# Patient Record
Sex: Female | Born: 1990 | Race: White | Hispanic: No | Marital: Single | Smoking: Never smoker
Health system: Southern US, Community
[De-identification: ages and names within clinical notes are randomized; demographics above are authoritative.]

## PROBLEM LIST (undated history)

## (undated) ENCOUNTER — Inpatient Hospital Stay (HOSPITAL_COMMUNITY): Payer: Self-pay

## (undated) DIAGNOSIS — F32A Depression, unspecified: Secondary | ICD-10-CM

## (undated) DIAGNOSIS — Z3490 Encounter for supervision of normal pregnancy, unspecified, unspecified trimester: Secondary | ICD-10-CM

## (undated) DIAGNOSIS — H9390 Unspecified disorder of ear, unspecified ear: Secondary | ICD-10-CM

## (undated) DIAGNOSIS — R0602 Shortness of breath: Secondary | ICD-10-CM

## (undated) DIAGNOSIS — F329 Major depressive disorder, single episode, unspecified: Secondary | ICD-10-CM

## (undated) DIAGNOSIS — O0930 Supervision of pregnancy with insufficient antenatal care, unspecified trimester: Secondary | ICD-10-CM

## (undated) DIAGNOSIS — Z789 Other specified health status: Secondary | ICD-10-CM

## (undated) HISTORY — DX: Supervision of pregnancy with insufficient antenatal care, unspecified trimester: O09.30

## (undated) HISTORY — PX: MYRINGOTOMY: SHX2060

## (undated) HISTORY — DX: Unspecified disorder of ear, unspecified ear: H93.90

---
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
## 2011-06-27 NOTE — L&D Delivery Note (Signed)
Delivery Note At 2:28 PM a viable female was delivered via Vaginal, Spontaneous Delivery (Presentation:OA ; LOT  ).  APGAR: 9, 9; weight P.   Placenta status: delivered, intact , Spontaneous.  Cord: 3 vessels with the following complications: None.    Anesthesia: Epidural  Episiotomy: none Lacerations: B labial Suture Repair: 2.0 vicryl rapide Est. Blood Loss (mL): 500cc  Mom to postpartum.  Baby to with mommy.  [name],Nancy Horton 01/12/2012, 2:53 PM  Br/ Nexplanon vs Depo/ A+

## 2011-07-11 ENCOUNTER — Encounter (HOSPITAL_COMMUNITY): Payer: Self-pay | Admitting: *Deleted

## 2011-07-11 ENCOUNTER — Emergency Department (HOSPITAL_COMMUNITY)
Admission: EM | Admit: 2011-07-11 | Discharge: 2011-07-11 | Disposition: A | Discharge status: Home / Self Care | Payer: Self-pay | Attending: Emergency Medicine | Admitting: Emergency Medicine

## 2011-07-11 DIAGNOSIS — J3489 Other specified disorders of nose and nasal sinuses: Secondary | ICD-10-CM | POA: Insufficient documentation

## 2011-07-11 DIAGNOSIS — O9989 Other specified diseases and conditions complicating pregnancy, childbirth and the puerperium: Secondary | ICD-10-CM | POA: Insufficient documentation

## 2011-07-11 DIAGNOSIS — R6883 Chills (without fever): Secondary | ICD-10-CM | POA: Insufficient documentation

## 2011-07-11 DIAGNOSIS — R05 Cough: Secondary | ICD-10-CM | POA: Insufficient documentation

## 2011-07-11 DIAGNOSIS — H9209 Otalgia, unspecified ear: Secondary | ICD-10-CM | POA: Insufficient documentation

## 2011-07-11 DIAGNOSIS — R51 Headache: Secondary | ICD-10-CM | POA: Insufficient documentation

## 2011-07-11 DIAGNOSIS — R070 Pain in throat: Secondary | ICD-10-CM | POA: Insufficient documentation

## 2011-07-11 DIAGNOSIS — R059 Cough, unspecified: Secondary | ICD-10-CM | POA: Insufficient documentation

## 2011-07-11 DIAGNOSIS — H6690 Otitis media, unspecified, unspecified ear: Secondary | ICD-10-CM | POA: Insufficient documentation

## 2011-07-11 LAB — URINE CULTURE: Culture  Setup Time: 201301152004

## 2011-07-11 LAB — URINALYSIS, ROUTINE W REFLEX MICROSCOPIC
Glucose, UA: NEGATIVE mg/dL
Nitrite: NEGATIVE
Protein, ur: NEGATIVE mg/dL
pH: 7 (ref 5.0–8.0)

## 2011-07-11 LAB — URINE MICROSCOPIC-ADD ON

## 2011-07-11 MED ORDER — PRENATAL RX 60-1 MG PO TABS: 1.0000 | ORAL_TABLET | Freq: Every day | ORAL | Status: DC

## 2011-07-11 MED ORDER — AMOXICILLIN 500 MG PO CAPS: 500.0000 mg | ORAL_CAPSULE | Freq: Three times a day (TID) | ORAL | Status: AC

## 2011-07-11 NOTE — ED Provider Notes (Signed)
History     CSN: [id number]  Arrival date & time 07/11/11  1717   First MD Initiated Contact with Patient 07/11/11 1833      Chief Complaint  Patient presents with  . Chills     Patient is a 21 y.o. female presenting with cough. The history is provided by the patient.  Cough This is a new problem. The current episode started 2 days ago. The problem occurs hourly. The problem has not changed since onset.The cough is non-productive. Associated symptoms include chills, ear congestion, ear pain, rhinorrhea and sore throat.  pt reports mild headache Reports cough/congestion Left ear pain reported No significant abd pain No back pain Approximately [redacted] weeks pregnant, no prenatal care  PMH - none  History reviewed. No pertinent past surgical history.  History reviewed. No pertinent family history.  History  Substance Use Topics  . Smoking status: Never Smoker   . Smokeless tobacco: Not on file  . Alcohol Use: No    OB History    Grav Para Term Preterm Abortions TAB SAB Ect Mult Living   1               Review of Systems  Constitutional: Positive for chills.  HENT: Positive for ear pain, sore throat and rhinorrhea.   Respiratory: Positive for cough.     Allergies  Review of patient's allergies indicates no known allergies.  Home Medications   Current Outpatient Rx  Name Route Sig Dispense Refill  . ACETAMINOPHEN 500 MG PO TABS Oral Take 500 mg by mouth every 6 (six) hours as needed. For headache    . OVER THE COUNTER MEDICATION Oral Take 1 capsule by mouth every 6 (six) hours as needed. For cold     Over the counter cold medicine    . AMOXICILLIN 500 MG PO CAPS Oral Take 1 capsule (500 mg total) by mouth 3 (three) times daily. 21 capsule 0  . PRENATAL RX 60-1 MG PO TABS Oral Take 1 tablet by mouth daily. 30 tablet 0    BP 108/74  Pulse 93  Temp(Src) 97.9 F (36.6 C) (Oral)  Resp 18  SpO2 99%  Physical Exam CONSTITUTIONAL: Well developed/well  nourished HEAD AND FACE: Normocephalic/atraumatic EYES: EOMI/PERRL ENMT: Mucous membranes moist, left TM - erythema noted to left TM with fluid in canal.  ?ruptured TM NECK: supple no meningeal signs SPINE:entire spine nontender CV: S1/S2 noted, no murmurs/rubs/gallops noted LUNGS: Lungs are clear to auscultation bilaterally, no apparent distress ABDOMEN: soft, nontender, no rebound or guarding Pt does not appear gravid GU:no cva tenderness NEURO: Pt is awake/alert, moves all extremitiesx4 EXTREMITIES: pulses normal, full ROM SKIN: warm, color normal PSYCH: no abnormalities of mood noted   ED Course  Procedures   Labs Reviewed  URINALYSIS, ROUTINE W REFLEX MICROSCOPIC - Abnormal; Notable for the following:    APPearance CLOUDY (*)    Ketones, ur >80 (*)    Leukocytes, UA MODERATE (*)    All other components within normal limits  URINE MICROSCOPIC-ADD ON - Abnormal; Notable for the following:    Squamous Epithelial / LPF MANY (*)    Bacteria, UA FEW (*)    All other components within normal limits  PREGNANCY, URINE  POCT PREGNANCY, URINE  URINE CULTURE    1. Otitis media       MDM  Nursing notes reviewed and considered in documentation All labs/vitals reviewed and considered      Will start abx for her presumed OM with  effusion, ?ruptured TM Otherwise, influenza possible, but well appearing, afebrile, onset at least 48 hrs ago Urine sent for culture, but she denies abd pain/dysuria/flank pain   Joya Gaskins, MD 07/11/11 1947

## 2011-07-11 NOTE — ED Notes (Signed)
The pt says she is probabily approx 3 months pregnant.  lmp 3 months ago

## 2011-07-11 NOTE — ED Notes (Signed)
pts pregnancy test was POSITIVE

## 2011-07-11 NOTE — Discharge Instructions (Signed)
Otitis Media, Adult A middle ear infection is an infection in the space behind the eardrum. The medical name for this is "otitis media." It may happen after a common cold. It is caused by a germ that starts growing in that space. You may feel swollen glands in your neck on the side of the ear infection. HOME CARE INSTRUCTIONS   Take your medicine as directed until it is gone, even if you feel better after the first few days.   Only take over-the-counter or prescription medicines for pain, discomfort, or fever as directed by your caregiver.   Occasional use of a nasal decongestant a couple times per day may help with discomfort and help the eustachian tube to drain better.  Follow up with your caregiver in 10 to 14 days or as directed, to be certain that the infection has cleared. Not keeping the appointment could result in a chronic or permanent injury, pain, hearing loss and disability. If there is any problem keeping the appointment, you must call back to this facility for assistance. SEEK IMMEDIATE MEDICAL CARE IF:   You are not getting better in 2 to 3 days.   You have pain that is not controlled with medication.   You feel worse instead of better.   You cannot use the medication as directed.   You develop swelling, redness or pain around the ear or stiffness in your neck.  MAKE SURE YOU:   Understand these instructions.   Will watch your condition.   Will get help right away if you are not doing well or get worse.  Document Released: 03/17/2004 Document Revised: 02/22/2011 Document Reviewed: 01/17/2008 [facility] Patient Information 2012 [place], [place].  RESOURCE GUIDE  Dental Problems  Patients with Medicaid: [facility] [phone]                                           [phone] [address] Phone:  [phone]                                                  Phone:  [phone]  If unable to pay or uninsured,  contact:  [facility] or [facility]. to become qualified for the adult dental clinic.  Chronic Pain Problems Contact [facility]  [phone] Patients need to be referred by their primary care doctor.  Insufficient Money for Medicine Contact [employer]:  call "211" or [facility] [phone].  No Primary Care Doctor Call [facility]  [phone] Other agencies that provide inexpensive medical care    [facility]  [phone]    [facility]  [phone]    [facility]  [phone]    [facility]  [phone]    [facility]  [phone]    [facility]  [phone]  Psychological Services [facility]  [phone] [facility]  [phone] [facility] Mental [facility]   [phone] (emergency services [phone])  Substance Abuse Resources [facility]  [phone] [facility] [phone] [facility] [phone] [facility]  [phone] Residential & Outpatient Substance Abuse Program  [phone]  Abuse/Neglect [facility] Child Abuse Hotline 603-243-5385 [facility] Child Abuse Hotline 805-468-5268 (After Hours)  Emergency Shelter [facility] [phone]  Maternity Homes [facility] [place] [facility] [phone] [facility] [phone]  MRSA Hotline #:   480-428-2914    [facility] Resources  [facility]     [employer]                          [facility] Dept. [address] [place]                       [address]      [address]  Blondell Reveal Phone:  [phone]                                   Phone:  [phone]                 Phone:  [phone]  [facility] Phone:  [phone]  [facility] Child Abuse  Hotline [phone] (After Hours)

## 2011-07-11 NOTE — ED Notes (Signed)
The pt has a cold and she has aching all over her body.  Headache sinus drainage all day

## 2011-07-14 NOTE — ED Notes (Signed)
+   Urine Patient treated with Amox-sensitive to same-chart appended per protocol MD 

## 2011-07-31 ENCOUNTER — Inpatient Hospital Stay (HOSPITAL_COMMUNITY)
Admission: AD | Admit: 2011-07-31 | Discharge: 2011-07-31 | Disposition: A | Discharge status: Home / Self Care | Payer: Self-pay | Source: Ambulatory Visit | Attending: Family Medicine | Admitting: Family Medicine

## 2011-07-31 ENCOUNTER — Encounter (HOSPITAL_COMMUNITY): Payer: Self-pay

## 2011-07-31 DIAGNOSIS — Z3201 Encounter for pregnancy test, result positive: Secondary | ICD-10-CM | POA: Insufficient documentation

## 2011-07-31 HISTORY — DX: Other specified health status: Z78.9

## 2011-07-31 NOTE — Progress Notes (Signed)
Patient states she has not had any prenatal care yet and need proof of pregnancy letter. Is not having any problems. Denies any pain, bleeding or leaking, no vomiting, diarrhea or discharge.

## 2011-07-31 NOTE — ED Provider Notes (Signed)
History   Pt presents today wishing to have a pregnancy verification letter. She states she is having no problems and she only needs confirmation for her insurance. She denies lower abd pain, vag dc, bleeding, or any other problems at this time.  No chief complaint on file.  HPI  OB History    Grav Para Term Preterm Abortions TAB SAB Ect Mult Living   1 0 0       0      Past Medical History  Diagnosis Date  . No pertinent past medical history     Past Surgical History  Procedure Date  . Myringotomy     Family History  Problem Relation Age of Onset  . Diabetes Maternal Grandfather     History  Substance Use Topics  . Smoking status: Never Smoker   . Smokeless tobacco: Never Used  . Alcohol Use: No    Allergies: No Known Allergies  Prescriptions prior to admission  Medication Sig Dispense Refill  . acetaminophen (TYLENOL) 500 MG tablet Take 500 mg by mouth every 6 (six) hours as needed. For headache      . OVER THE COUNTER MEDICATION Take 1 capsule by mouth every 6 (six) hours as needed. For cold     Over the counter cold medicine      . Prenatal Vit-Fe Fumarate-FA (PRENATAL MULTIVITAMIN) 60-1 MG tablet Take 1 tablet by mouth daily.  30 tablet  0    Review of Systems  Constitutional: Negative for fever.  Eyes: Negative for blurred vision and double vision.  Respiratory: Negative for cough, hemoptysis, sputum production, shortness of breath and wheezing.   Cardiovascular: Negative for chest pain and palpitations.  Gastrointestinal: Negative for nausea, vomiting, abdominal pain, diarrhea, constipation and blood in stool.  Genitourinary: Negative for dysuria, urgency, frequency and hematuria.  Neurological: Negative for dizziness and headaches.  Psychiatric/Behavioral: Negative for depression and suicidal ideas.   Physical Exam   Blood pressure 109/62, pulse 99, temperature 98.4 F (36.9 C), temperature source Oral, resp. rate 16, height 5' 1.5" (1.562 m), weight  164 lb (74.39 kg), last menstrual period 04/15/2011.  Physical Exam  Nursing note and vitals reviewed. Constitutional: She appears well-developed and well-nourished. No distress.  HENT:  Head: Normocephalic and atraumatic.  Eyes: EOM are normal. Pupils are equal, round, and reactive to light.  GI: Soft. She exhibits no distension and no mass. There is no tenderness. There is no rebound and no guarding.  Neurological: She is alert. She has normal reflexes.  Skin: Skin is warm and dry. She is not diaphoretic.  Psychiatric: She has a normal mood and affect. Her behavior is normal. Judgment and thought content normal.    MAU Course  Procedures  Preg test positive. FHTs positive.  Assessment and Plan  Preg test positive: discussed with pt at length. She is to begin prenatal care. Discussed diet, activity, risks, and precautions.  Clinton Gallant. Rice III, DrHSc, MPAS, PA-C  07/31/2011, 4:01 PM   Henrietta Hoover, PA 07/31/11 1605

## 2011-08-01 NOTE — ED Provider Notes (Signed)
Chart reviewed and agree with management and plan.  

## 2011-10-31 LAB — OB RESULTS CONSOLE ANTIBODY SCREEN: Antibody Screen: NEGATIVE

## 2011-10-31 LAB — OB RESULTS CONSOLE HEPATITIS B SURFACE ANTIGEN: Hepatitis B Surface Ag: NEGATIVE

## 2011-10-31 LAB — OB RESULTS CONSOLE RUBELLA ANTIBODY, IGM: Rubella: IMMUNE

## 2011-10-31 LAB — OB RESULTS CONSOLE GC/CHLAMYDIA: Chlamydia: NEGATIVE

## 2011-10-31 LAB — OB RESULTS CONSOLE ABO/RH

## 2011-11-02 ENCOUNTER — Other Ambulatory Visit (HOSPITAL_COMMUNITY): Payer: Self-pay | Admitting: Obstetrics and Gynecology

## 2011-11-02 DIAGNOSIS — O2690 Pregnancy related conditions, unspecified, unspecified trimester: Secondary | ICD-10-CM

## 2011-11-09 ENCOUNTER — Ambulatory Visit (HOSPITAL_COMMUNITY)
Admission: RE | Admit: 2011-11-09 | Discharge: 2011-11-09 | Disposition: A | Discharge status: Home / Self Care | Payer: Medicaid Other | Source: Ambulatory Visit | Attending: Obstetrics and Gynecology | Admitting: Obstetrics and Gynecology

## 2011-11-09 ENCOUNTER — Other Ambulatory Visit: Payer: Self-pay

## 2011-11-09 VITALS — BP 89/63 | HR 70 | Wt 175.0 lb

## 2011-11-09 DIAGNOSIS — Z1389 Encounter for screening for other disorder: Secondary | ICD-10-CM | POA: Insufficient documentation

## 2011-11-09 DIAGNOSIS — O2690 Pregnancy related conditions, unspecified, unspecified trimester: Secondary | ICD-10-CM

## 2011-11-09 DIAGNOSIS — O358XX0 Maternal care for other (suspected) fetal abnormality and damage, not applicable or unspecified: Secondary | ICD-10-CM | POA: Insufficient documentation

## 2011-11-09 DIAGNOSIS — Z363 Encounter for antenatal screening for malformations: Secondary | ICD-10-CM | POA: Insufficient documentation

## 2011-11-09 DIAGNOSIS — O0930 Supervision of pregnancy with insufficient antenatal care, unspecified trimester: Secondary | ICD-10-CM | POA: Insufficient documentation

## 2011-11-09 NOTE — Progress Notes (Signed)
Ms. Domanski was seen for ultrasound appointment today.  Please see [facility] report for details.

## 2012-01-08 ENCOUNTER — Encounter (HOSPITAL_COMMUNITY): Payer: Self-pay | Admitting: *Deleted

## 2012-01-08 ENCOUNTER — Telehealth (HOSPITAL_COMMUNITY): Payer: Self-pay | Admitting: *Deleted

## 2012-01-08 NOTE — Telephone Encounter (Signed)
Preadmission screen  

## 2012-01-11 ENCOUNTER — Encounter (HOSPITAL_COMMUNITY): Payer: Self-pay

## 2012-01-11 DIAGNOSIS — Z3490 Encounter for supervision of normal pregnancy, unspecified, unspecified trimester: Secondary | ICD-10-CM

## 2012-01-11 DIAGNOSIS — O0930 Supervision of pregnancy with insufficient antenatal care, unspecified trimester: Secondary | ICD-10-CM | POA: Insufficient documentation

## 2012-01-11 HISTORY — DX: Encounter for supervision of normal pregnancy, unspecified, unspecified trimester: Z34.90

## 2012-01-11 NOTE — H&P (Signed)
Nancy Horton is a 21 y.o. female G1P0 at 28+ for IOL given post dates.  D/W pt r/b/a of IOL.  Uncomplicated prenatal care, except late entry to care, dated by 86 + week [place].   Maternal Medical History:  Fetal activity: Perceived fetal activity is normal.      OB History    Grav Para Term Preterm Abortions TAB SAB Ect Mult Living   1 0 0       0    G1 present, late entry to care Past Medical History  Diagnosis Date  . No pertinent past medical history   . Late prenatal care   . Ear problems   . Normal pregnancy 01/11/2012   Past Surgical History  Procedure Date  . Myringotomy    Family History: family history includes Diabetes in her maternal grandfather and Heart attack in her father. Social History:  reports that she has never smoked. She has never used smokeless tobacco. She reports that she does not drink alcohol or use illicit drugs.  Meds PNV All NKDA   Prenatal Transfer Tool  Maternal Diabetes: No Genetic Screening: Declined too late to care Maternal Ultrasounds/Referrals: Normal, limited anat Fetal Ultrasounds or other Referrals:  Referred to Materal Fetal Medicine  completes anat Maternal Substance Abuse:  No Significant Maternal Medications:  None Significant Maternal Lab Results:  Lab values include: Group B Strep negative Other Comments:  None  Review of Systems  Constitutional: Negative.   HENT: Negative.   Respiratory: Negative.   Cardiovascular: Negative.   Gastrointestinal: Negative.   Genitourinary: Negative.   Musculoskeletal: Negative.   Skin: Negative.   Neurological: Negative.   Psychiatric/Behavioral: Negative.       Last menstrual period 04/15/2011. Maternal Exam:  Abdomen: Fundal height is appropriate for gestation.   Estimated fetal weight is 8#.   Fetal presentation: vertex     Physical Exam  Constitutional: She is oriented to person, place, and time. She appears well-developed and well-nourished.  HENT:  Head: Normocephalic  and atraumatic.  Neck: Normal range of motion. Neck supple. No thyromegaly present.  Cardiovascular: Normal rate and regular rhythm.   Respiratory: Effort normal and breath sounds normal. No respiratory distress.  GI: Soft. Bowel sounds are normal. There is no tenderness.  Musculoskeletal: Normal range of motion.  Neurological: She is alert and oriented to person, place, and time.  Skin: Skin is warm and dry.  Psychiatric: She has a normal mood and affect. Her behavior is normal.    Prenatal labs: ABO, Rh: A/Positive/-- (05/07 0000) Antibody: Negative (05/07 0000) Rubella: Immune (05/07 0000) RPR: Nonreactive (05/07 0000)  HBsAg: Negative (05/07 0000)  HIV: Non-reactive (05/07 0000)  GBS: Negative (06/14 0000)  Hgb 10.8/ Plt 215K/ GC neg/ Chl neg/ CF neg/ genetic screening too late to care  [place] 5/6 [facility] 01/02/12, limited anatomy, female, posterior placenta  MFM [place] - completes anatomy  Tdap 10/31/11  Assessment/Plan: 21yo G1P0 at 41+ for iol given postdates gbbs negative Pitocin and AROM for induction Expect SVD    [name],Tessa Seaberry 01/11/2012, 11:07 AM

## 2012-01-12 ENCOUNTER — Encounter (HOSPITAL_COMMUNITY): Payer: Self-pay

## 2012-01-12 ENCOUNTER — Inpatient Hospital Stay (HOSPITAL_COMMUNITY): Payer: Medicaid Other | Admitting: Anesthesiology

## 2012-01-12 ENCOUNTER — Encounter (HOSPITAL_COMMUNITY): Payer: Self-pay | Admitting: Anesthesiology

## 2012-01-12 ENCOUNTER — Inpatient Hospital Stay (HOSPITAL_COMMUNITY)
Admission: RE | Admit: 2012-01-12 | Discharge: 2012-01-14 | DRG: 775 | Disposition: A | Discharge status: Home / Self Care | Payer: Medicaid Other | Source: Ambulatory Visit | Attending: Obstetrics and Gynecology | Admitting: Obstetrics and Gynecology

## 2012-01-12 VITALS — BP 100/63 | HR 70 | Temp 97.8°F | Resp 18 | Ht 62.0 in | Wt 175.0 lb

## 2012-01-12 DIAGNOSIS — O480 Post-term pregnancy: Principal | ICD-10-CM | POA: Diagnosis present

## 2012-01-12 DIAGNOSIS — O0930 Supervision of pregnancy with insufficient antenatal care, unspecified trimester: Secondary | ICD-10-CM

## 2012-01-12 DIAGNOSIS — Z3490 Encounter for supervision of normal pregnancy, unspecified, unspecified trimester: Secondary | ICD-10-CM

## 2012-01-12 HISTORY — DX: Shortness of breath: R06.02

## 2012-01-12 HISTORY — DX: Encounter for supervision of normal pregnancy, unspecified, unspecified trimester: Z34.90

## 2012-01-12 HISTORY — DX: Major depressive disorder, single episode, unspecified: F32.9

## 2012-01-12 HISTORY — DX: Depression, unspecified: F32.A

## 2012-01-12 LAB — CBC
MCH: 28.2 pg (ref 26.0–34.0)
MCHC: 32.7 g/dL (ref 30.0–36.0)
MCV: 86.2 fL (ref 78.0–100.0)
Platelets: 191 10*3/uL (ref 150–400)
RDW: 14.6 % (ref 11.5–15.5)

## 2012-01-12 LAB — RPR: RPR Ser Ql: NONREACTIVE

## 2012-01-12 MED ORDER — BENZOCAINE-MENTHOL 20-0.5 % EX AERO
1.0000 "application " | INHALATION_SPRAY | CUTANEOUS | Status: DC | PRN
  Administered 2012-01-12: 1 via TOPICAL
  Filled 2012-01-12: qty 56

## 2012-01-12 MED ORDER — FENTANYL 2.5 MCG/ML BUPIVACAINE 1/10 % EPIDURAL INFUSION (WH - ANES)
INTRAMUSCULAR | Status: DC | PRN
  Administered 2012-01-12: 14 mL/h via EPIDURAL

## 2012-01-12 MED ORDER — PHENYLEPHRINE 40 MCG/ML (10ML) SYRINGE FOR IV PUSH (FOR BLOOD PRESSURE SUPPORT)
80.0000 ug | PREFILLED_SYRINGE | INTRAVENOUS | Status: DC | PRN
  Filled 2012-01-12: qty 5

## 2012-01-12 MED ORDER — ZOLPIDEM TARTRATE 5 MG PO TABS: 5.0000 mg | ORAL_TABLET | Freq: Every evening | ORAL | Status: DC | PRN

## 2012-01-12 MED ORDER — IBUPROFEN 600 MG PO TABS: 600.0000 mg | ORAL_TABLET | Freq: Four times a day (QID) | ORAL | Status: DC | PRN

## 2012-01-12 MED ORDER — DIBUCAINE 1 % RE OINT: 1.0000 "application " | TOPICAL_OINTMENT | RECTAL | Status: DC | PRN

## 2012-01-12 MED ORDER — DIPHENHYDRAMINE HCL 25 MG PO CAPS: 25.0000 mg | ORAL_CAPSULE | Freq: Four times a day (QID) | ORAL | Status: DC | PRN

## 2012-01-12 MED ORDER — ACETAMINOPHEN 325 MG PO TABS: 650.0000 mg | ORAL_TABLET | ORAL | Status: DC | PRN

## 2012-01-12 MED ORDER — SENNOSIDES-DOCUSATE SODIUM 8.6-50 MG PO TABS
2.0000 | ORAL_TABLET | Freq: Every day | ORAL | Status: DC
  Administered 2012-01-12: 2 via ORAL

## 2012-01-12 MED ORDER — FLEET ENEMA 7-19 GM/118ML RE ENEM: 1.0000 | ENEMA | RECTAL | Status: DC | PRN

## 2012-01-12 MED ORDER — PRENATAL MULTIVITAMIN CH
1.0000 | ORAL_TABLET | Freq: Every day | ORAL | Status: DC
  Administered 2012-01-12 – 2012-01-14 (×3): 1 via ORAL
  Filled 2012-01-12 (×3): qty 1

## 2012-01-12 MED ORDER — OXYCODONE-ACETAMINOPHEN 5-325 MG PO TABS: 1.0000 | ORAL_TABLET | ORAL | Status: DC | PRN

## 2012-01-12 MED ORDER — ONDANSETRON HCL 4 MG/2ML IJ SOLN: 4.0000 mg | Freq: Four times a day (QID) | INTRAMUSCULAR | Status: DC | PRN

## 2012-01-12 MED ORDER — LANOLIN HYDROUS EX OINT: TOPICAL_OINTMENT | CUTANEOUS | Status: DC | PRN

## 2012-01-12 MED ORDER — LACTATED RINGERS IV SOLN: 500.0000 mL | Freq: Once | INTRAVENOUS | Status: DC

## 2012-01-12 MED ORDER — OXYTOCIN BOLUS FROM INFUSION
250.0000 mL | Freq: Once | INTRAVENOUS | Status: DC
  Filled 2012-01-12: qty 500

## 2012-01-12 MED ORDER — OXYTOCIN 40 UNITS IN LACTATED RINGERS INFUSION - SIMPLE MED
62.5000 mL/h | Freq: Once | INTRAVENOUS | Status: AC
  Administered 2012-01-12: 999 mL/h via INTRAVENOUS

## 2012-01-12 MED ORDER — SIMETHICONE 80 MG PO CHEW: 80.0000 mg | CHEWABLE_TABLET | ORAL | Status: DC | PRN

## 2012-01-12 MED ORDER — FENTANYL 2.5 MCG/ML BUPIVACAINE 1/10 % EPIDURAL INFUSION (WH - ANES)
14.0000 mL/h | INTRAMUSCULAR | Status: DC
  Administered 2012-01-12: 14 mL/h via EPIDURAL
  Filled 2012-01-12 (×2): qty 60

## 2012-01-12 MED ORDER — DIPHENHYDRAMINE HCL 50 MG/ML IJ SOLN
12.5000 mg | INTRAMUSCULAR | Status: DC | PRN
Start: 2012-01-12 — End: 2012-01-12

## 2012-01-12 MED ORDER — EPHEDRINE 5 MG/ML INJ
10.0000 mg | INTRAVENOUS | Status: DC | PRN
  Filled 2012-01-12: qty 4

## 2012-01-12 MED ORDER — EPHEDRINE 5 MG/ML INJ: 10.0000 mg | INTRAVENOUS | Status: DC | PRN

## 2012-01-12 MED ORDER — TERBUTALINE SULFATE 1 MG/ML IJ SOLN: 0.2500 mg | Freq: Once | INTRAMUSCULAR | Status: DC | PRN

## 2012-01-12 MED ORDER — LACTATED RINGERS IV SOLN
INTRAVENOUS | Status: DC
  Administered 2012-01-12: 10:00:00 via INTRAVENOUS
  Administered 2012-01-12: 1000 mL via INTRAVENOUS

## 2012-01-12 MED ORDER — LACTATED RINGERS IV SOLN: INTRAVENOUS | Status: DC

## 2012-01-12 MED ORDER — PRENATAL MULTIVITAMIN CH: 1.0000 | ORAL_TABLET | Freq: Every day | ORAL | Status: DC

## 2012-01-12 MED ORDER — CITRIC ACID-SODIUM CITRATE 334-500 MG/5ML PO SOLN: 30.0000 mL | ORAL | Status: DC | PRN

## 2012-01-12 MED ORDER — TETANUS-DIPHTH-ACELL PERTUSSIS 5-2.5-18.5 LF-MCG/0.5 IM SUSP: 0.5000 mL | Freq: Once | INTRAMUSCULAR | Status: DC

## 2012-01-12 MED ORDER — LIDOCAINE HCL (PF) 1 % IJ SOLN: 30.0000 mL | INTRAMUSCULAR | Status: DC | PRN

## 2012-01-12 MED ORDER — OXYTOCIN 40 UNITS IN LACTATED RINGERS INFUSION - SIMPLE MED
1.0000 m[IU]/min | INTRAVENOUS | Status: DC
  Administered 2012-01-12: 2 m[IU]/min via INTRAVENOUS
  Filled 2012-01-12: qty 1000

## 2012-01-12 MED ORDER — SODIUM BICARBONATE 8.4 % IV SOLN
INTRAVENOUS | Status: DC | PRN
  Administered 2012-01-12: 4 mL via EPIDURAL

## 2012-01-12 MED ORDER — LACTATED RINGERS IV SOLN: 500.0000 mL | INTRAVENOUS | Status: DC | PRN

## 2012-01-12 MED ORDER — ONDANSETRON HCL 4 MG/2ML IJ SOLN: 4.0000 mg | INTRAMUSCULAR | Status: DC | PRN

## 2012-01-12 MED ORDER — IBUPROFEN 600 MG PO TABS
600.0000 mg | ORAL_TABLET | Freq: Four times a day (QID) | ORAL | Status: DC
  Administered 2012-01-12 – 2012-01-14 (×7): 600 mg via ORAL
  Filled 2012-01-12 (×7): qty 1

## 2012-01-12 MED ORDER — ONDANSETRON HCL 4 MG PO TABS: 4.0000 mg | ORAL_TABLET | ORAL | Status: DC | PRN

## 2012-01-12 MED ORDER — PHENYLEPHRINE 40 MCG/ML (10ML) SYRINGE FOR IV PUSH (FOR BLOOD PRESSURE SUPPORT): 80.0000 ug | PREFILLED_SYRINGE | INTRAVENOUS | Status: DC | PRN

## 2012-01-12 MED ORDER — WITCH HAZEL-GLYCERIN EX PADS: 1.0000 "application " | MEDICATED_PAD | CUTANEOUS | Status: DC | PRN

## 2012-01-12 NOTE — Progress Notes (Signed)
Patient ID: Nancy Horton, female   DOB: 1991/06/14, 21 y.o.   MRN: [id number] 20yo G1P0 at 41+ dated by 31 week [place] for iol +FM, no LOF, no VB< occ ctx AFVSS FHTs 130's, mod var toco q 1-2 SVE 4.5/90/0 AROM for clear fluid, fingers presenting with head  Pitocin at 4 mU Expect SVD

## 2012-01-12 NOTE — Anesthesia Preprocedure Evaluation (Addendum)

## 2012-01-12 NOTE — Anesthesia Procedure Notes (Signed)

## 2012-01-13 LAB — CBC
Hemoglobin: 8.8 g/dL — ABNORMAL LOW (ref 12.0–15.0)
MCH: 28.5 pg (ref 26.0–34.0)
MCHC: 32.8 g/dL (ref 30.0–36.0)
Platelets: 191 10*3/uL (ref 150–400)
RDW: 14.8 % (ref 11.5–15.5)

## 2012-01-13 NOTE — Progress Notes (Signed)
Post Partum Day 1 Subjective: no complaints, up ad lib, tolerating PO and nl lochia, pain controlled  Objective: Blood pressure 96/60, pulse 73, temperature 98.2 F (36.8 C), temperature source Oral, resp. rate 18, height 5\' 2"  (1.575 m), weight 79.379 kg (175 lb), last menstrual period 04/15/2011, SpO2 100.00%, unknown if currently breastfeeding.  Physical Exam:  General: alert and no distress Lochia: appropriate Uterine Fundus: firm   Basename 01/13/12 0500 01/12/12 0705  HGB 8.8* 10.8*  HCT 26.8* 33.0*    Assessment/Plan: Plan for discharge tomorrow, Breastfeeding and Lactation consult.  Doing well.     LOS: 1 day   [name],Bryant Saye 01/13/2012, 10:24 AM

## 2012-01-14 MED ORDER — IBUPROFEN 800 MG PO TABS: 800.0000 mg | ORAL_TABLET | Freq: Three times a day (TID) | ORAL | Status: AC | PRN

## 2012-01-14 MED ORDER — OXYCODONE-ACETAMINOPHEN 5-325 MG PO TABS: 1.0000 | ORAL_TABLET | Freq: Four times a day (QID) | ORAL | Status: AC | PRN

## 2012-01-14 MED ORDER — PRENATAL MULTIVITAMIN CH: 1.0000 | ORAL_TABLET | Freq: Every day | ORAL | Status: DC

## 2012-01-14 NOTE — Progress Notes (Signed)
Post Partum Day 2 Subjective: no complaints, up ad lib, tolerating PO and nl lochia, pain controlled  Objective: Blood pressure 100/63, pulse 70, temperature 97.8 F (36.6 C), temperature source Oral, resp. rate 18, height 5\' 2"  (1.575 m), weight 79.379 kg (175 lb), last menstrual period 04/15/2011, SpO2 98.00%, unknown if currently breastfeeding.  Physical Exam:  General: alert and no distress Lochia: appropriate Uterine Fundus: firm    Basename 01/13/12 0500 01/12/12 0705  HGB 8.8* 10.8*  HCT 26.8* 33.0*    Assessment/Plan: Discharge home, Breastfeeding and Lactation consult.  D/c home with motrin/percocet/pnv, f/u 6 weeks   LOS: 2 days   [name],Wyatt Galvan 01/14/2012, 9:36 AM

## 2012-01-14 NOTE — Discharge Summary (Signed)
Obstetric Discharge Summary Reason for Admission: induction of labor Prenatal Procedures: none Intrapartum Procedures: spontaneous vaginal delivery Postpartum Procedures: none Complications-Operative and Postpartum: B labial laceration Hemoglobin  Date Value Range Status  01/13/2012 8.8* 12.0 - 15.0 g/dL Final     DELTA CHECK NOTED     REPEATED TO VERIFY     HCT  Date Value Range Status  01/13/2012 26.8* 36.0 - 46.0 % Final    Physical Exam:  General: alert and no distress Lochia: appropriate Uterine Fundus: firm   Discharge Diagnoses: Term Pregnancy-delivered  Discharge Information: Date: 01/14/2012 Activity: pelvic rest Diet: routine Medications: PNV, Ibuprofen and Percocet Condition: stable Instructions: refer to practice specific booklet Discharge to: home Follow-up Information    Follow up with Nancy Horton,Nancy Jarecki, MD. Schedule an appointment as soon as possible for a visit in 6 weeks.   Contact information:   [address] [facility] [address] [place] [address] [phone]          Newborn Data: Live born female  Birth Weight: 7 lb 14.1 oz (3575 g) APGAR: 9, 9  Home with mother.  Nancy Horton,Nancy Horton 01/14/2012, 9:43 AM

## 2012-01-15 NOTE — Progress Notes (Signed)
Post discharge chart review completed.  

## 2012-03-16 DIAGNOSIS — M7650 Patellar tendinitis, unspecified knee: Secondary | ICD-10-CM | POA: Insufficient documentation

## 2012-03-17 ENCOUNTER — Emergency Department (HOSPITAL_COMMUNITY): Payer: Medicaid Other

## 2012-03-17 ENCOUNTER — Emergency Department (HOSPITAL_COMMUNITY)
Admission: EM | Admit: 2012-03-17 | Discharge: 2012-03-17 | Disposition: A | Discharge status: Home Health | Payer: Medicaid Other | Attending: Emergency Medicine | Admitting: Emergency Medicine

## 2012-03-17 ENCOUNTER — Encounter (HOSPITAL_COMMUNITY): Payer: Self-pay | Admitting: *Deleted

## 2012-03-17 DIAGNOSIS — M7650 Patellar tendinitis, unspecified knee: Secondary | ICD-10-CM

## 2012-03-17 MED ORDER — IBUPROFEN 800 MG PO TABS: 800.0000 mg | ORAL_TABLET | Freq: Three times a day (TID) | ORAL | Status: DC

## 2012-03-17 MED ORDER — HYDROCODONE-ACETAMINOPHEN 5-500 MG PO TABS: 2.0000 | ORAL_TABLET | Freq: Four times a day (QID) | ORAL | Status: DC | PRN

## 2012-03-17 MED ORDER — HYDROCODONE-ACETAMINOPHEN 5-325 MG PO TABS
1.0000 | ORAL_TABLET | Freq: Once | ORAL | Status: AC
  Administered 2012-03-17: 1 via ORAL
  Filled 2012-03-17: qty 1

## 2012-03-17 NOTE — ED Provider Notes (Signed)
Medical screening examination/treatment/procedure(s) were performed by non-physician practitioner and as supervising physician I was immediately available for consultation/collaboration.  Zachariah Pavek, MD 03/17/12 0732 

## 2012-03-17 NOTE — ED Notes (Signed)
Right knee pain for 3 days,  No known injury,

## 2012-03-17 NOTE — ED Provider Notes (Signed)
History     CSN: [id number]  Arrival date & time 03/16/12  2342   First MD Initiated Contact with Patient 03/17/12 [phone]      Chief Complaint  Patient presents with  . Knee Pain    (Consider location/radiation/quality/duration/timing/severity/associated sxs/prior treatment) HPI  Patient presents to the ER with complaints of left knee pain 3 days. She does not remember injuring it but is on her feet a lot. She denies being unable to bear weight but admits that every step is tender. She does not have any rash, fevers, nausea, vomiting, diarrhea, laceration to her knee. NAD/VSS  Past Medical History  Diagnosis Date  . No pertinent past medical history   . Late prenatal care   . Ear problems   . Normal pregnancy 01/11/2012  . Depression   . Shortness of breath     Past Surgical History  Procedure Date  . Myringotomy     Family History  Problem Relation Age of Onset  . Diabetes Maternal Grandfather   . Heart attack Father     History  Substance Use Topics  . Smoking status: Never Smoker   . Smokeless tobacco: Never Used  . Alcohol Use: No    OB History    Grav Para Term Preterm Abortions TAB SAB Ect Mult Living   1 1 1       1       Review of Systems  Review of Systems  Gen: no weight loss, fevers, chills, night sweats  Eyes: no discharge or drainage, no occular pain or visual changes  Nose: no epistaxis or rhinorrhea  Mouth: no dental pain, no sore throat  Neck: no neck pain  Lungs:No wheezing, coughing or hemoptysis CV: no chest pain, palpitations, dependent edema or orthopnea  Abd: no abdominal pain, nausea, vomiting  GU: no dysuria or gross hematuria  MSK:  Left knee pain Neuro: no headache, no focal neurologic deficits  Skin: no abnormalities Psyche: negative.   Allergies  Review of patient's allergies indicates no known allergies.  Home Medications   Current Outpatient Rx  Name Route Sig Dispense Refill  . IBUPROFEN 800 MG PO TABS Oral Take  800 mg by mouth every 8 (eight) hours as needed.    Marland Kitchen PRENATAL MULTIVITAMIN CH Oral Take 1 tablet by mouth daily. 30 tablet 12  . HYDROCODONE-ACETAMINOPHEN 5-500 MG PO TABS Oral Take 2 tablets by mouth every 6 (six) hours as needed for pain. 10 tablet 0  . IBUPROFEN 800 MG PO TABS Oral Take 1 tablet (800 mg total) by mouth 3 (three) times daily. 21 tablet 0    BP 111/66  Pulse 69  Temp 98.3 F (36.8 C) (Oral)  Resp 17  Ht 5\' 3"  (1.6 m)  Wt 167 lb (75.751 kg)  BMI 29.58 kg/m2  SpO2 100%  LMP 03/17/2012  Physical Exam  Nursing note and vitals reviewed. Constitutional: She appears well-developed and well-nourished. No distress.  HENT:  Head: Normocephalic and atraumatic.  Eyes: Pupils are equal, round, and reactive to light.  Neck: Normal range of motion. Neck supple.  Cardiovascular: Normal rate and regular rhythm.   Pulmonary/Chest: Effort normal.  Abdominal: Soft.  Musculoskeletal:       Right knee: She exhibits decreased range of motion. She exhibits no swelling, no effusion, no ecchymosis, no deformity, no laceration, no erythema, normal alignment, no LCL laxity and normal patellar mobility. tenderness found. Patellar tendon (with crepitis to the tendon) tenderness noted.  No induration or rash  Neurological: She is alert.  Skin: Skin is warm and dry.    ED Course  Procedures (including critical care time)  Labs Reviewed - No data to display Dg Knee 2 Views Right  03/17/2012  *RADIOLOGY REPORT*  Clinical Data: Knee pain  RIGHT KNEE - 1-2 VIEW  Comparison: None  Findings: There is no evidence of fracture or dislocation.  There is no evidence of arthropathy or other focal bone abnormality. Soft tissues are unremarkable.  IMPRESSION: Negative exam.   Original Report Authenticated By: Rosealee Albee, M.D.      1. Patellar tendinitis       MDM  Pt given knee immobilizer and crutches with Ibuprofen and Vicodin. She has also been given a referral to Ortho and asked  to call.  Pt has been advised of the symptoms that warrant their return to the ED. Patient has voiced understanding and has agreed to follow-up with the PCP or specialist.         Dorthula Matas, PA 03/17/12 [phone]

## 2012-06-14 ENCOUNTER — Emergency Department (INDEPENDENT_AMBULATORY_CARE_PROVIDER_SITE_OTHER)
Admission: EM | Admit: 2012-06-14 | Discharge: 2012-06-14 | Disposition: A | Discharge status: Home / Self Care | Payer: Self-pay | Source: Home / Self Care

## 2012-06-14 ENCOUNTER — Encounter (HOSPITAL_COMMUNITY): Payer: Self-pay | Admitting: Emergency Medicine

## 2012-06-14 DIAGNOSIS — H6590 Unspecified nonsuppurative otitis media, unspecified ear: Secondary | ICD-10-CM

## 2012-06-14 DIAGNOSIS — H68009 Unspecified Eustachian salpingitis, unspecified ear: Secondary | ICD-10-CM

## 2012-06-14 MED ORDER — AMOXICILLIN 500 MG PO CAPS: 500.0000 mg | ORAL_CAPSULE | Freq: Three times a day (TID) | ORAL | Status: DC

## 2012-06-14 NOTE — ED Notes (Signed)
Pt c/o left ear pain x2 weeks... Sx include: drainage from left ear, decreased hearing... Denies: fevers, vomiting, nauseas, diarrhea, cold sx... She is alert and responsive w/no signs of acute distress.

## 2012-06-14 NOTE — ED Provider Notes (Signed)
History     CSN: [id number]  Arrival date & time 06/14/12  1556   None     Chief Complaint  Patient presents with  . Otalgia    (Consider location/radiation/quality/duration/timing/severity/associated sxs/prior treatment) HPI Comments: 21 year old female complains of left earache and drainage. Just has intermittent decrease in hearing. States that at one moment she can well next moment hearing is decreased and then abates spontaneously. She denies fever, chills or sore throat.   Past Medical History  Diagnosis Date  . No pertinent past medical history   . Late prenatal care   . Ear problems   . Normal pregnancy 01/11/2012  . Depression   . Shortness of breath     Past Surgical History  Procedure Date  . Myringotomy     Family History  Problem Relation Age of Onset  . Diabetes Maternal Grandfather   . Heart attack Father     History  Substance Use Topics  . Smoking status: Never Smoker   . Smokeless tobacco: Never Used  . Alcohol Use: No    OB History    Grav Para Term Preterm Abortions TAB SAB Ect Mult Living   1 1 1       1       Review of Systems  Constitutional: Negative for fever, chills, activity change, appetite change and fatigue.  HENT: Positive for postnasal drip. Negative for congestion, sore throat, facial swelling, rhinorrhea, neck pain and neck stiffness.   Eyes: Negative.   Respiratory: Negative.   Cardiovascular: Negative.   Gastrointestinal: Negative.   Skin: Negative for pallor and rash.  Neurological: Negative.     Allergies  Review of patient's allergies indicates no known allergies.  Home Medications   Current Outpatient Rx  Name  Route  Sig  Dispense  Refill  . AMOXICILLIN 500 MG PO CAPS   Oral   Take 1 capsule (500 mg total) by mouth 3 (three) times daily. 1 qid for 10 d   40 capsule   0   . HYDROCODONE-ACETAMINOPHEN 5-500 MG PO TABS   Oral   Take 2 tablets by mouth every 6 (six) hours as needed for pain.   10  tablet   0   . IBUPROFEN 800 MG PO TABS   Oral   Take 800 mg by mouth every 8 (eight) hours as needed.         . IBUPROFEN 800 MG PO TABS   Oral   Take 1 tablet (800 mg total) by mouth 3 (three) times daily.   21 tablet   0   . PRENATAL MULTIVITAMIN CH   Oral   Take 1 tablet by mouth daily.   30 tablet   12     BP 114/72  Pulse 78  Temp 98.7 F (37.1 C)  Resp 20  SpO2 99%  LMP 06/14/2012  Breastfeeding? No  Physical Exam  Constitutional: She is oriented to person, place, and time. She appears well-developed and well-nourished. No distress.  HENT:       Right TM normal Left TM with small areas of erythema. Some landmarks are lost. There are 4 specks of redness on the TM. No hemotympanums or rupture is appreciated. There is a small bulge in the upper aspect of the TM  Eyes: Conjunctivae normal and EOM are normal.  Neck: Normal range of motion. Neck supple.  Cardiovascular: Normal rate and regular rhythm.   Pulmonary/Chest: Effort normal and breath sounds normal. No respiratory distress.  Musculoskeletal: Normal  range of motion. She exhibits no edema.  Lymphadenopathy:    She has no cervical adenopathy.  Neurological: She is alert and oriented to person, place, and time.  Skin: Skin is warm and dry. No rash noted.  Psychiatric: She has a normal mood and affect.    ED Course  Procedures (including critical care time)  Labs Reviewed - No data to display No results found.   1. Otitis media with effusion   2. Salpingitis, eustachian tube       MDM  Amoxicillin 500 mg 4 times a day for 10 days Sudafed PE 10 mg every 4 hours when necessary head congestion Plain Robitussin every 4 hours when necessary Tylenol or 4 hours if needed for ear discomfort. Portable your doctor next week if needed. Return if worse      \   Hayden Rasmussen, NP 06/14/12 1710

## 2012-06-14 NOTE — ED Provider Notes (Signed)
Medical screening examination/treatment/procedure(s) were performed by resident physician or non-physician practitioner and as supervising physician I was immediately available for consultation/collaboration.   Senora Lacson DOUGLAS MD.    Mariane Burpee D Forrester Blando, MD 06/14/12 1959 

## 2012-10-02 ENCOUNTER — Emergency Department (INDEPENDENT_AMBULATORY_CARE_PROVIDER_SITE_OTHER)
Admission: EM | Admit: 2012-10-02 | Discharge: 2012-10-02 | Disposition: A | Discharge status: Home / Self Care | Payer: Medicaid Other | Source: Home / Self Care

## 2012-10-02 ENCOUNTER — Encounter (HOSPITAL_COMMUNITY): Payer: Self-pay

## 2012-10-02 DIAGNOSIS — R1110 Vomiting, unspecified: Secondary | ICD-10-CM

## 2012-10-02 MED ORDER — ONDANSETRON HCL 4 MG PO TABS: 4.0000 mg | ORAL_TABLET | Freq: Three times a day (TID) | ORAL | Status: DC | PRN

## 2012-10-02 NOTE — ED Provider Notes (Signed)
History     CSN: [id number]  Arrival date & time 10/02/12  1209   First MD Initiated Contact with Patient 10/02/12 1233      Chief Complaint  Patient presents with  . Emesis    (Consider location/radiation/quality/duration/timing/severity/associated sxs/prior treatment) HPI 22 year old female with no significant past medical medical history who presented with nausea and vomiting started prior to this visit. Patient woke up nauseous and has had one episode of nonbloody vomiting. She also reported feeling generalized myalgias and fatigue all started this morning. Patient reported no diarrhea or constipation. No fever or chills. No upper respiratory tract symptoms. Patient has baby at home which she reports is not sick. No sick contacts at home.  Past Medical History  Diagnosis Date  . No pertinent past medical history   . Late prenatal care   . Ear problems   . Normal pregnancy 01/11/2012  . Depression   . Shortness of breath     Past Surgical History  Procedure Laterality Date  . Myringotomy      Family History  Problem Relation Age of Onset  . Diabetes Maternal Grandfather   . Heart attack Father     History  Substance Use Topics  . Smoking status: Never Smoker   . Smokeless tobacco: Never Used  . Alcohol Use: No    OB History   Grav Para Term Preterm Abortions TAB SAB Ect Mult Living   1 1 1       1       Review of Systems  Constitutional: Negative for fever, chills, diaphoresis, activity change, appetite change and fatigue.  HENT: Negative for ear pain, nosebleeds, congestion, facial swelling, rhinorrhea, neck pain, neck stiffness and ear discharge.   Eyes: Negative for pain, discharge, redness, itching and visual disturbance.  Respiratory: Negative for cough, choking, chest tightness, shortness of breath, wheezing and stridor.   Cardiovascular: Negative for chest pain, palpitations and leg swelling.  Gastrointestinal: Negative for abdominal distention.   positive for nausea and vomiting. Genitourinary: Negative for dysuria, urgency, frequency, hematuria, flank pain, decreased urine volume, difficulty urinating and dyspareunia.  Musculoskeletal: Positive for back pain, negative for joint swelling, arthralgias and gait problem.  Neurological: Negative for dizziness, tremors, seizures, syncope, facial asymmetry, speech difficulty, weakness, light-headedness, numbness and headaches.  Hematological: Negative for adenopathy. Does not bruise/bleed easily.  Psychiatric/Behavioral: Negative for hallucinations, behavioral problems, confusion, dysphoric mood, decreased concentration and agitation.    Allergies  Review of patient's allergies indicates no known allergies.  Home Medications   Current Outpatient Rx  Name  Route  Sig  Dispense  Refill  . amoxicillin (AMOXIL) 500 MG capsule   Oral   Take 1 capsule (500 mg total) by mouth 3 (three) times daily. 1 qid for 10 d   40 capsule   0   . HYDROcodone-acetaminophen (VICODIN) 5-500 MG per tablet   Oral   Take 2 tablets by mouth every 6 (six) hours as needed for pain.   10 tablet   0   . ibuprofen (ADVIL,MOTRIN) 800 MG tablet   Oral   Take 800 mg by mouth every 8 (eight) hours as needed.         Marland Kitchen ibuprofen (ADVIL,MOTRIN) 800 MG tablet   Oral   Take 1 tablet (800 mg total) by mouth 3 (three) times daily.   21 tablet   0   . Prenatal Vit-Fe Fumarate-FA (PRENATAL MULTIVITAMIN) TABS   Oral   Take 1 tablet by mouth daily.  30 tablet   12     BP 118/77  Pulse 68  Temp(Src) 98 F (36.7 C) (Oral)  Resp 17  SpO2 99%  Physical Exam  Constitutional: Appears well-developed and well-nourished. No distress.  HENT: Normocephalic. External right and left ear normal. Oropharynx is clear and moist.  Eyes: Conjunctivae and EOM are normal. PERRLA, no scleral icterus.  Neck: Normal ROM. Neck supple. No JVD. No tracheal deviation. No thyromegaly.  CVS: RRR, S1/S2 +, no murmurs, no gallops,  no carotid bruit.  Pulmonary: Effort and breath sounds normal, no stridor, rhonchi, wheezes, rales.  Abdominal: Soft. BS +,  no distension, tenderness, rebound or guarding.  Musculoskeletal: Normal range of motion. No edema and no tenderness.  Lymphadenopathy: No lymphadenopathy noted, cervical, inguinal. Neuro: Alert. Normal reflexes, muscle tone coordination. No cranial nerve deficit. Skin: Skin is warm and dry. No rash noted. Not diaphoretic. No erythema. No pallor.  Psychiatric: Normal mood and affect. Behavior, judgment, thought content normal.    ED Course  Procedures (including critical care time)  Labs Reviewed - No data to display No results found.   Viral gastroenteritis - Patient had only one episode of vomiting and so far has not had any vomiting but is nauseous - Instructed the patient to continue drinking fluids slowly and alternate with chicken broth or Gatorade to replenish electrolytes - Prescription provided for Zofran as needed - Patient was instructed if she can't keep anything down and feels as if she is getting dehydrated to go to emergency room for further evaluation and possibly IV fluids.   MDM  Viral gastroenteritis        Alison Murray, MD 10/02/12 1241

## 2012-10-02 NOTE — ED Notes (Signed)
Patient states has been vomiting since this am Pain radiates from her abd to her back on the right side
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
## 2013-10-31 ENCOUNTER — Encounter (HOSPITAL_COMMUNITY): Payer: Self-pay | Admitting: Emergency Medicine

## 2013-10-31 ENCOUNTER — Emergency Department (HOSPITAL_COMMUNITY)
Admission: EM | Admit: 2013-10-31 | Discharge: 2013-10-31 | Disposition: A | Discharge status: Home / Self Care | Payer: Medicaid Other | Attending: Emergency Medicine | Admitting: Emergency Medicine

## 2013-10-31 DIAGNOSIS — Z791 Long term (current) use of non-steroidal anti-inflammatories (NSAID): Secondary | ICD-10-CM | POA: Insufficient documentation

## 2013-10-31 DIAGNOSIS — Z8659 Personal history of other mental and behavioral disorders: Secondary | ICD-10-CM | POA: Insufficient documentation

## 2013-10-31 DIAGNOSIS — H6090 Unspecified otitis externa, unspecified ear: Secondary | ICD-10-CM

## 2013-10-31 DIAGNOSIS — H6120 Impacted cerumen, unspecified ear: Secondary | ICD-10-CM | POA: Insufficient documentation

## 2013-10-31 DIAGNOSIS — H6690 Otitis media, unspecified, unspecified ear: Secondary | ICD-10-CM

## 2013-10-31 DIAGNOSIS — H60399 Other infective otitis externa, unspecified ear: Secondary | ICD-10-CM | POA: Insufficient documentation

## 2013-10-31 DIAGNOSIS — D492 Neoplasm of unspecified behavior of bone, soft tissue, and skin: Secondary | ICD-10-CM

## 2013-10-31 DIAGNOSIS — L918 Other hypertrophic disorders of the skin: Secondary | ICD-10-CM

## 2013-10-31 DIAGNOSIS — Z79899 Other long term (current) drug therapy: Secondary | ICD-10-CM | POA: Insufficient documentation

## 2013-10-31 DIAGNOSIS — L908 Other atrophic disorders of skin: Secondary | ICD-10-CM | POA: Insufficient documentation

## 2013-10-31 MED ORDER — CIPROFLOXACIN-DEXAMETHASONE 0.3-0.1 % OT SUSP: 4.0000 [drp] | Freq: Two times a day (BID) | OTIC | Status: DC

## 2013-10-31 MED ORDER — AMOXICILLIN 500 MG PO CAPS: 500.0000 mg | ORAL_CAPSULE | Freq: Three times a day (TID) | ORAL | Status: DC

## 2013-10-31 NOTE — ED Notes (Signed)
She c/o left-sided earache, plus some "yellow" drng. From left ear; x 3 days.  She also has what appears to be a keloid vs. Epithelioma on her upper left ear pinna.  She is in no distress.

## 2013-10-31 NOTE — ED Provider Notes (Signed)
CSN: [id number]     Arrival date & time 10/31/13  1247 History   This chart was scribed for Quincy Carnes, PA working with Wandra Arthurs, MD, by Delphia Grates, ED Scribe. This patient was seen in room WTR7/WTR7 and the patient's care was started at 12:57 PM.   Chief Complaint  Patient presents with  . Otalgia    The history is provided by the patient. No language interpreter was used.    HPI Comments: Nancy Horton is a 23 y.o. female who presents to the Emergency Department complaining of left ear pain that began three days ago. There is associated "yellow" drainage. She states she had surgery in the past on the left ear and has experienced some hearing loss, but she reports this to be baseline. She denies right ear pain, drainage, or hearing loss. Patient reports history of ear problems and states she is prone to ear infections.  Past Medical History  Diagnosis Date  . No pertinent past medical history   . Late prenatal care   . Ear problems   . Normal pregnancy 01/11/2012  . Depression   . Shortness of breath    Past Surgical History  Procedure Laterality Date  . Myringotomy     Family History  Problem Relation Age of Onset  . Diabetes Maternal Grandfather   . Heart attack Father    History  Substance Use Topics  . Smoking status: Never Smoker   . Smokeless tobacco: Never Used  . Alcohol Use: No   OB History   Grav Para Term Preterm Abortions TAB SAB Ect Mult Living   1 1 1       1      Review of Systems  HENT: Positive for ear discharge (Left ear) and ear pain (Left ear). Negative for hearing loss.   All other systems reviewed and are negative.     Allergies  Review of patient's allergies indicates no known allergies.  Home Medications   Prior to Admission medications   Medication Sig Start Date End Date Taking? Authorizing Provider  amoxicillin (AMOXIL) 500 MG capsule Take 1 capsule (500 mg total) by mouth 3 (three) times daily. 1 qid for 10 d 06/14/12    Janne Napoleon, NP  HYDROcodone-acetaminophen (VICODIN) 5-500 MG per tablet Take 2 tablets by mouth every 6 (six) hours as needed for pain. 03/17/12   Tiffany Marilu Favre, PA-C  ibuprofen (ADVIL,MOTRIN) 800 MG tablet Take 800 mg by mouth every 8 (eight) hours as needed.    Historical Provider, MD  ibuprofen (ADVIL,MOTRIN) 800 MG tablet Take 1 tablet (800 mg total) by mouth 3 (three) times daily. 03/17/12   Tiffany Marilu Favre, PA-C  ondansetron (ZOFRAN) 4 MG tablet Take 1 tablet (4 mg total) by mouth every 8 (eight) hours as needed for nausea. 10/02/12   Robbie Lis, MD  Prenatal Vit-Fe Fumarate-FA (PRENATAL MULTIVITAMIN) TABS Take 1 tablet by mouth daily. 01/14/12   Jody [name]-Stuckert, MD   BP 104/60  Pulse 59  Temp(Src) 98.3 F (36.8 C) (Oral)  Resp 16  SpO2 100%  Physical Exam  Nursing note and vitals reviewed. Constitutional: She is oriented to person, place, and time. She appears well-developed and well-nourished. No distress.  HENT:  Head: Normocephalic and atraumatic.  Right Ear: Tympanic membrane, external ear and ear canal normal.  Mouth/Throat: Oropharynx is clear and moist.  Left cerumen impaction. Moderate sized growth to helix of left ear; no fluctuance or surrounding erythema; no drainage from lesion;  non-tender to palpation  Eyes: Conjunctivae and EOM are normal. Pupils are equal, round, and reactive to light.  Neck: Normal range of motion. Neck supple.  Cardiovascular: Normal rate, regular rhythm and normal heart sounds.   Pulmonary/Chest: Effort normal and breath sounds normal. No respiratory distress. She has no wheezes.  Musculoskeletal: Normal range of motion.  Neurological: She is alert and oriented to person, place, and time.  Skin: Skin is warm and dry. She is not diaphoretic.  Psychiatric: She has a normal mood and affect.    ED Course  EAR CERUMEN REMOVAL Date/Time: 10/31/2013 1:10 PM Performed by: Larene Pickett Authorized by: Larene Pickett Consent: Verbal  consent obtained. Risks and benefits: risks, benefits and alternatives were discussed Consent given by: patient Patient understanding: patient states understanding of the procedure being performed Patient identity confirmed: verbally with patient Local anesthetic: none Location details: left ear Procedure type: curette Patient sedated: no Patient tolerance: Patient tolerated the procedure well with no immediate complications.   (including critical care time)  DIAGNOSTIC STUDIES: Oxygen Saturation is 100% on room air, nomal by my interpretation.    COORDINATION OF CARE: At 2774 Discussed treatment plan with patient which includes antibiotics. Patient agrees.   Labs Review Labs Reviewed - No data to display  Imaging Review No results found.   EKG Interpretation None      MDM   Final diagnoses:  Otitis media  Otitis externa  Abnormal skin growth   After cerumen disimpaction, TM was found to be erythematous with foul smelling drainage present.  Growth of left helix without fluctuance or erythema to suggest acute abscess or other infection.  Pt states growth has been present for several months but has not seen anyone about it until today, is non-tender on exam.  Will tx for OM and OE, refer to ENT for further evaluation of growth/removal.  Discussed plan with patient, he/she acknowledged understanding and agreed with plan of care.  Return precautions given for new or worsening symptoms.  I personally performed the services described in this documentation, which was scribed in my presence. The recorded information has been reviewed and is accurate.  Larene Pickett, PA-C 10/31/13 1312

## 2013-10-31 NOTE — Discharge Instructions (Signed)
Take the prescribed medication as directed. Follow-up with ENT for further evaluation/removal of ear growth.-- call and schedule appt with Dr. Constance Holster. Return to the ED for new or worsening symptoms.

## 2013-10-31 NOTE — ED Provider Notes (Signed)
Medical screening examination/treatment/procedure(s) were performed by non-physician practitioner and as supervising physician I was immediately available for consultation/collaboration.   EKG Interpretation None        Wandra Arthurs, MD 10/31/13 1416

## 2014-04-27 ENCOUNTER — Encounter (HOSPITAL_COMMUNITY): Payer: Self-pay | Admitting: Emergency Medicine

## 2014-08-12 IMAGING — CR DG KNEE 1-2V*R*
2 series · 2 of 2 positions shown · non-contrast
Comparison: None

CLINICAL DATA: Knee pain

RIGHT KNEE - 1-2 VIEW

[t knee ap right]
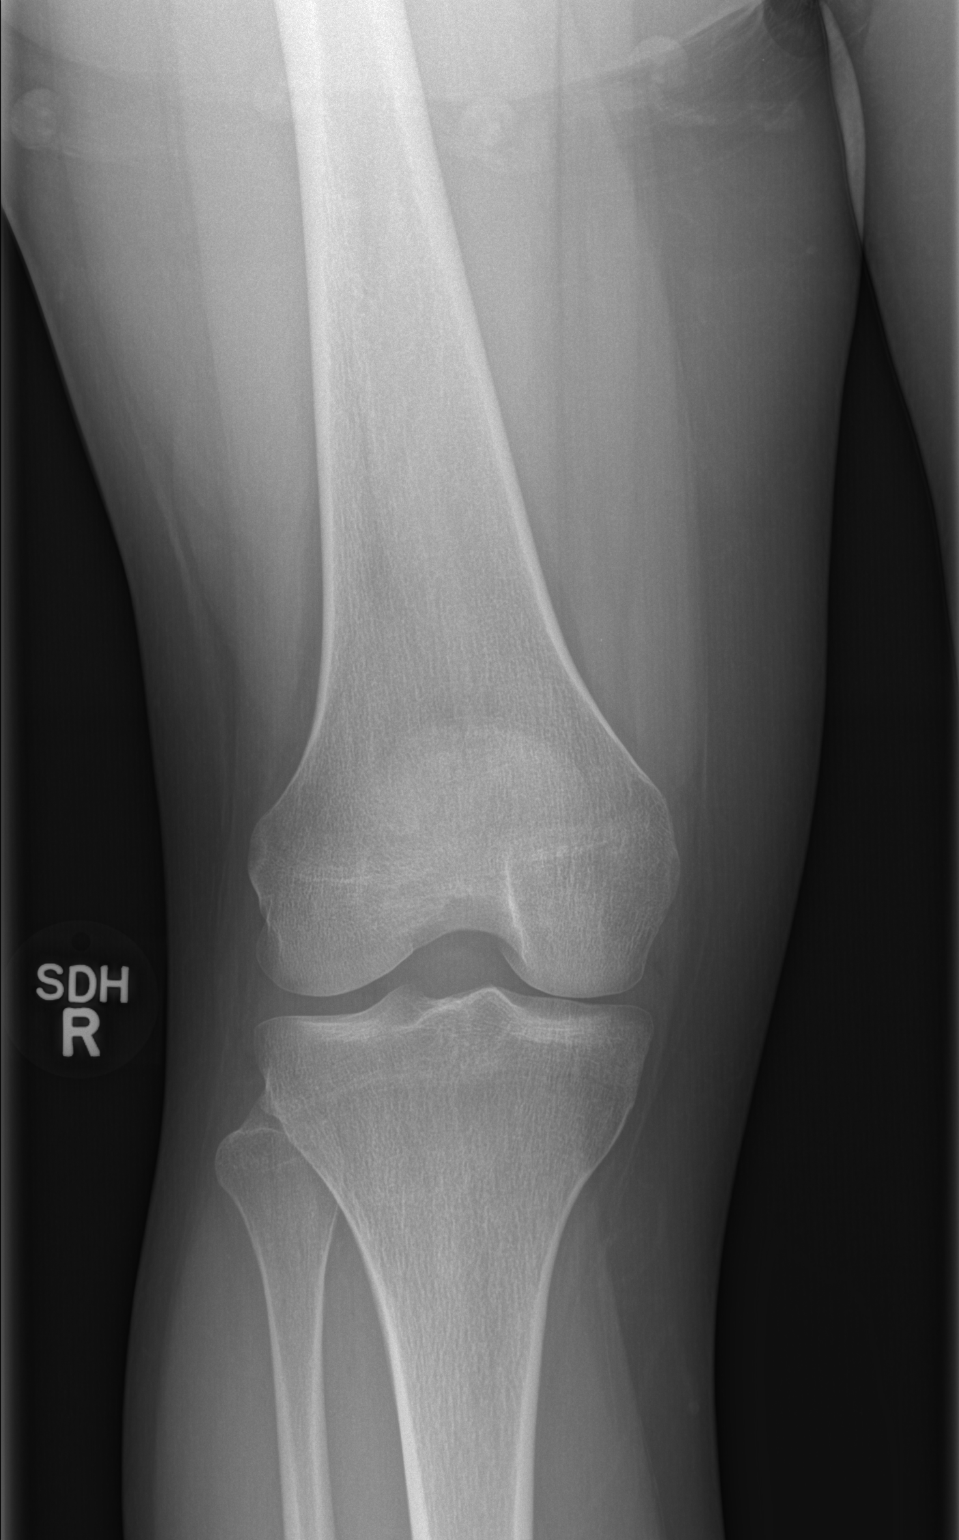

[t knee obl right]
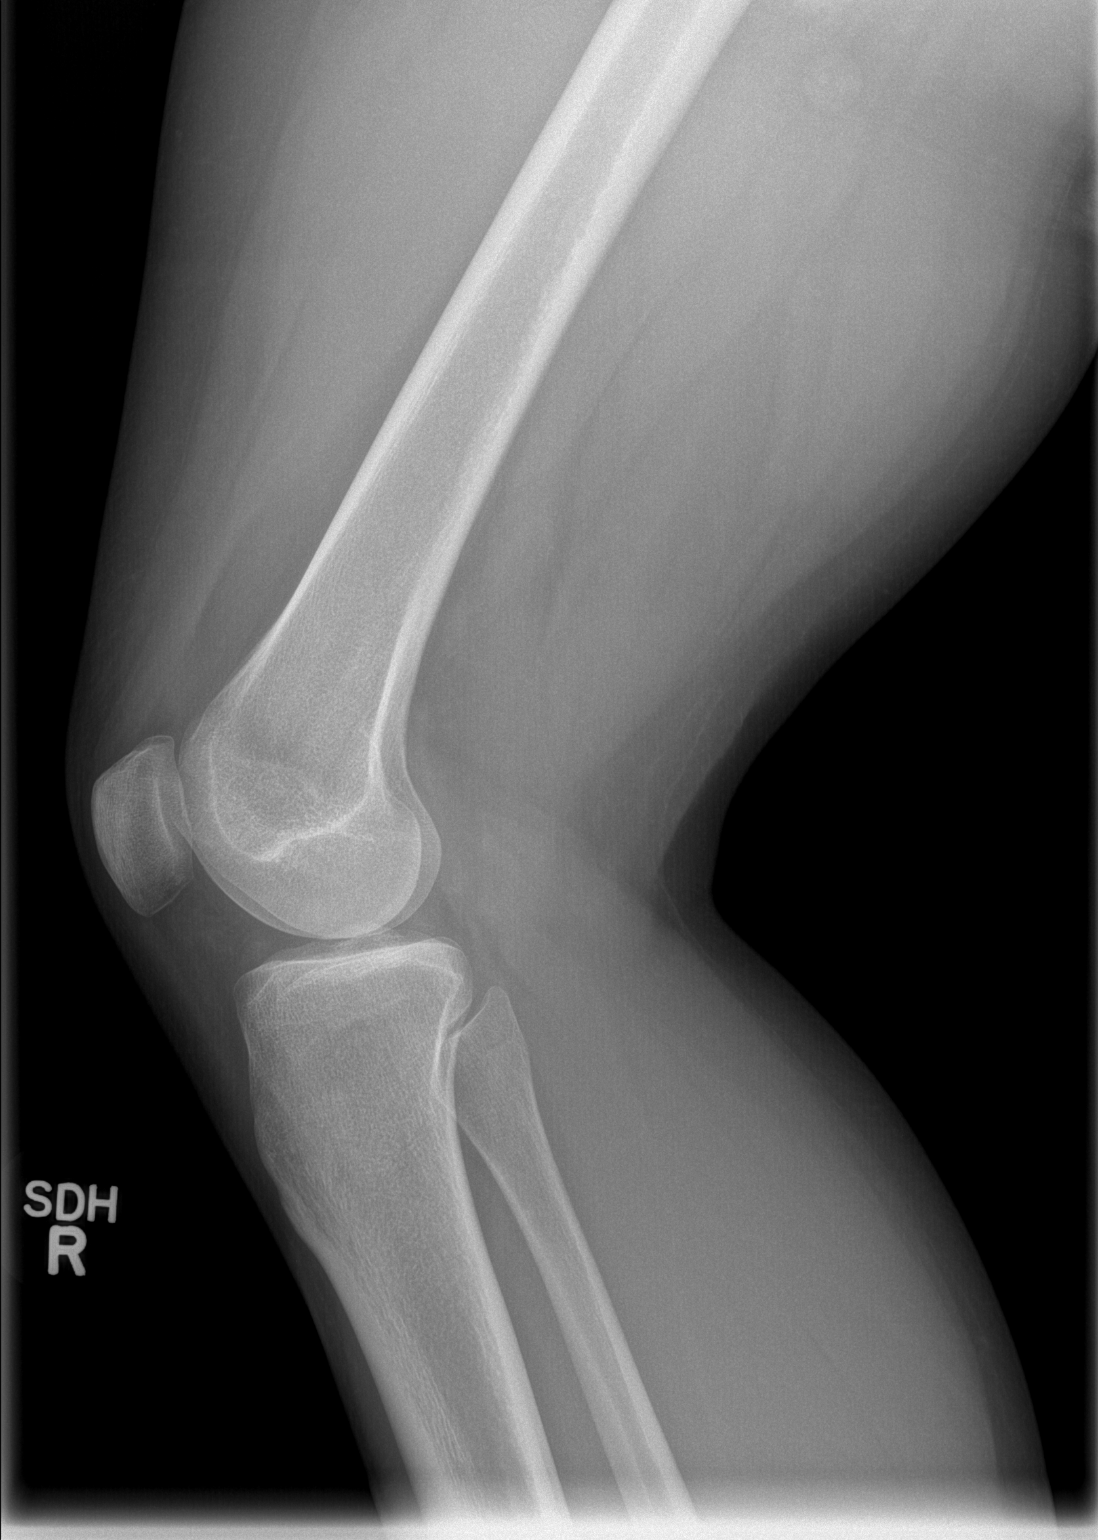

[2 of 2 positions shown; findings below may reference images not displayed]

FINDINGS: There is no evidence of fracture or dislocation.  There
is no evidence of arthropathy or other focal bone abnormality.
Soft tissues are unremarkable.
IMPRESSION: Negative exam.

## 2016-05-11 ENCOUNTER — Encounter (HOSPITAL_COMMUNITY): Payer: Self-pay | Admitting: Emergency Medicine

## 2016-05-11 ENCOUNTER — Ambulatory Visit (HOSPITAL_COMMUNITY)
Admission: EM | Admit: 2016-05-11 | Discharge: 2016-05-11 | Disposition: A | Discharge status: Home / Self Care | Payer: Medicaid Other | Attending: Emergency Medicine | Admitting: Emergency Medicine

## 2016-05-11 DIAGNOSIS — H60392 Other infective otitis externa, left ear: Secondary | ICD-10-CM

## 2016-05-11 DIAGNOSIS — W57XXXA Bitten or stung by nonvenomous insect and other nonvenomous arthropods, initial encounter: Secondary | ICD-10-CM

## 2016-05-11 MED ORDER — NEOMYCIN-POLYMYXIN-HC 3.5-10000-1 OT SUSP: 4.0000 [drp] | Freq: Three times a day (TID) | OTIC | 0 refills | Status: DC

## 2016-05-11 MED ORDER — NEOMYCIN-POLYMYXIN-HC 3.5-10000-1 OT SUSP: 4.0000 [drp] | Freq: Three times a day (TID) | OTIC | 0 refills | Status: AC

## 2016-05-11 NOTE — Discharge Instructions (Signed)
Start Cortisporin drops- 4 drops in left ear 3 times a day. Do not put any other objects or drops in ear. Recommend cool compresses to bite area on right arm. May take Benadryl as needed for itching. Follow-up in 3 to 4 days if not improving.

## 2016-05-11 NOTE — ED Provider Notes (Signed)
CSN: [id number]     Arrival date & time 05/11/16  1324 History   First MD Initiated Contact with Patient 05/11/16 1526     Chief Complaint  Patient presents with  . Mass  . Otalgia   (Consider location/radiation/quality/duration/timing/severity/associated sxs/prior Treatment) 25 year old female presents with left ear pain with yellow discharge for the past week. She also has had a low grade fever and headache for the past few days. She denies any nasal congestion, coughing or GI symptoms. She has not taken any medication or put any drops in the ear for symptoms. She has a history of recurrent otitis externa- last infection about 2 years ago.  She also requests evaluation of a "bump" on her right deltoid. Uncertain if she got bit by a bug.  Noticed it 2 days ago- is going down but still slightly itchy. Otherwise takes no daily medication.   The history is provided by the patient.    Past Medical History:  Diagnosis Date  . Depression   . Ear problems   . Late prenatal care   . No pertinent past medical history   . Normal pregnancy 01/11/2012  . Shortness of breath    Past Surgical History:  Procedure Laterality Date  . MYRINGOTOMY     Family History  Problem Relation Age of Onset  . Diabetes Maternal Grandfather   . Heart attack Father    Social History  Substance Use Topics  . Smoking status: Never Smoker  . Smokeless tobacco: Never Used  . Alcohol use No   OB History    Gravida Para Term Preterm AB Living   1 1 1     1    SAB TAB Ectopic Multiple Live Births           1     Review of Systems  Constitutional: Positive for fatigue and fever. Negative for chills.  HENT: Positive for ear discharge and ear pain. Negative for congestion, facial swelling, hearing loss, rhinorrhea, sinus pain, sinus pressure, sore throat and tinnitus.   Eyes: Negative for discharge.  Respiratory: Negative for cough.   Cardiovascular: Negative for chest pain.  Gastrointestinal: Negative  for nausea and vomiting.  Musculoskeletal: Negative for neck pain and neck stiffness.  Skin: Positive for rash.  Neurological: Positive for headaches. Negative for dizziness and weakness.  Hematological: Negative for adenopathy.    Allergies  Patient has no known allergies.  Home Medications   Prior to Admission medications   Medication Sig Start Date End Date Taking? Authorizing Provider  neomycin-polymyxin-hydrocortisone (CORTISPORIN) 3.5-10000-1 otic suspension Place 4 drops into the left ear 3 (three) times daily. 05/11/16 05/18/16  Katy Apo, NP   Meds Ordered and Administered this Visit  Medications - No data to display  BP 110/72 (BP Location: Left Arm)   Pulse (!) 50   Temp 98.6 F (37 C) (Oral)   Resp 16   SpO2 96%  No data found.   Physical Exam  Constitutional: She is oriented to person, place, and time. She appears well-developed and well-nourished. No distress.  HENT:  Head: Normocephalic and atraumatic.    Right Ear: Hearing, tympanic membrane, external ear and ear canal normal.  Left Ear: Hearing and tympanic membrane normal. There is drainage, swelling and tenderness. No foreign bodies. Tympanic membrane is not injected, not perforated and not erythematous. No decreased hearing is noted.  Nose: Nose normal.  Mouth/Throat: Uvula is midline, oropharynx is clear and moist and mucous membranes are normal.  Cauliflower growth on posterior left pinna- mid portion of ear. Non-tender.    Neck: Normal range of motion. Neck supple.  Cardiovascular: Normal rate, regular rhythm and normal heart sounds.   Pulmonary/Chest: Effort normal and breath sounds normal. No respiratory distress. She has no wheezes. She has no rhonchi. She has no rales.  Lymphadenopathy:    She has no cervical adenopathy.  Neurological: She is alert and oriented to person, place, and time.  Skin: Skin is warm, dry and intact. Capillary refill takes less than 2 seconds. Lesion noted.       Small pink raised papule present on right upper inner deltoid. Slight surrounding erythema that blanches,. No pus or discharge present. Non-tender.   Psychiatric: She has a normal mood and affect. Her behavior is normal. Judgment and thought content normal.    Urgent Care Course   Clinical Course     Procedures (including critical care time)  Labs Review Labs Reviewed - No data to display  Imaging Review No results found.   Visual Acuity Review  Right Eye Distance:   Left Eye Distance:   Bilateral Distance:    Right Eye Near:   Left Eye Near:    Bilateral Near:         MDM   1. Other infective acute otitis externa of left ear   2. Bug bite, initial encounter    Recommend Cortisporin 4 drops in left ear 3 times a day as directed. Do not put any other objects except drops in ear.  Recommend cool compresses to bite area on right arm. May take Benadryl 25mg  every 6 hours as needed for itching. Follow-up in 3 to 4 days if not improving.     Katy Apo, NP 05/12/16 [phone]

## 2016-05-11 NOTE — ED Triage Notes (Signed)
Here for a lump on right delt onset 2 days associated w/pain that is subsiding, and redness  Denies inj/trauma  Also c/o left ear pain onset x1 week  A&O x4... ND

## 2017-10-13 ENCOUNTER — Ambulatory Visit (HOSPITAL_COMMUNITY)
Admission: EM | Admit: 2017-10-13 | Discharge: 2017-10-13 | Disposition: A | Discharge status: Home / Self Care | Payer: Self-pay | Attending: Family Medicine | Admitting: Family Medicine

## 2017-10-13 ENCOUNTER — Other Ambulatory Visit: Payer: Self-pay

## 2017-10-13 ENCOUNTER — Encounter (HOSPITAL_COMMUNITY): Payer: Self-pay | Admitting: Emergency Medicine

## 2017-10-13 DIAGNOSIS — K047 Periapical abscess without sinus: Secondary | ICD-10-CM

## 2017-10-13 MED ORDER — HYDROCODONE-ACETAMINOPHEN 5-325 MG PO TABS: 1.0000 | ORAL_TABLET | Freq: Four times a day (QID) | ORAL | 0 refills | Status: AC | PRN

## 2017-10-13 MED ORDER — AMOXICILLIN-POT CLAVULANATE 875-125 MG PO TABS: 1.0000 | ORAL_TABLET | Freq: Two times a day (BID) | ORAL | 0 refills | Status: AC

## 2017-10-13 NOTE — ED Provider Notes (Signed)
[place]    CSN: [id number] Arrival date & time: 10/13/17  1209     History   Chief Complaint Chief Complaint  Patient presents with  . Oral Swelling    HPI Nancy Horton is a 27 y.o. female presenting today for facial swelling.  States that beginning 2 days ago she has had increased swelling and pain to her right upper jaw.  She notes that she has a cavity in her molar that is being removed this upcoming Wednesday.  She has been taking Tylenol 1000 as well as ibuprofen 600 alternating but is not controlling her pain well.  Denies any fevers, difficulty swallowing or breathing.  HPI  Past Medical History:  Diagnosis Date  . Depression   . Ear problems   . Late prenatal care   . No pertinent past medical history   . Normal pregnancy 01/11/2012  . Shortness of breath     Patient Active Problem List   Diagnosis Date Noted  . SVD (spontaneous vaginal delivery) 01/12/2012  . Late prenatal care 01/11/2012  . Normal pregnancy 01/11/2012    Past Surgical History:  Procedure Laterality Date  . MYRINGOTOMY      OB History    Gravida  1   Para  1   Term  1   Preterm      AB      Living  1     SAB      TAB      Ectopic      Multiple      Live Births  1            Home Medications    Prior to Admission medications   Medication Sig Start Date End Date Taking? Authorizing Provider  levonorgestrel (MIRENA) 20 MCG/24HR IUD 1 each by Intrauterine route once.   Yes [provider]  amoxicillin-clavulanate (AUGMENTIN) 875-125 MG tablet Take 1 tablet by mouth every 12 (twelve) hours for 10 days. 10/13/17 10/23/17  Soriyah Osberg C, PA-C  HYDROcodone-acetaminophen (NORCO/VICODIN) 5-325 MG tablet Take 1 tablet by mouth every 6 (six) hours as needed for up to 3 days. 10/13/17 10/16/17  Krishauna Schatzman, Elesa Hacker, PA-C    Family History Family History  Problem Relation Age of Onset  . Diabetes Maternal Grandfather   . Heart attack Father      Social History Social History   Tobacco Use  . Smoking status: Never Smoker  . Smokeless tobacco: Never Used  Substance Use Topics  . Alcohol use: No  . Drug use: No     Allergies   Patient has no known allergies.   Review of Systems Review of Systems  Constitutional: Negative for activity change, appetite change, fatigue and fever.  HENT: Positive for dental problem and facial swelling. Negative for congestion, ear pain, sore throat and trouble swallowing.   Respiratory: Negative for cough and shortness of breath.   Cardiovascular: Negative for chest pain.  Gastrointestinal: Negative for abdominal pain, nausea and vomiting.  Neurological: Negative for dizziness, weakness, light-headedness, numbness and headaches.     Physical Exam Triage Vital Signs ED Triage Vitals  Enc Vitals Group     BP 10/13/17 1317 128/85     Pulse Rate 10/13/17 1317 65     Resp 10/13/17 1317 16     Temp 10/13/17 1317 98.3 F (36.8 C)     Temp Source 10/13/17 1317 Oral     SpO2 10/13/17 1317 100 %  Weight --      Height --      Head Circumference --      Peak Flow --      Pain Score 10/13/17 1326 7     Pain Loc --      Pain Edu? --      Excl. in [place]? --    No data found.  Updated Vital Signs BP 128/85 (BP Location: Right Arm)   Pulse 65   Temp 98.3 F (36.8 C) (Oral)   Resp 16   SpO2 100%   Visual Acuity Right Eye Distance:   Left Eye Distance:   Bilateral Distance:    Right Eye Near:   Left Eye Near:    Bilateral Near:     Physical Exam  Constitutional: She appears well-developed and well-nourished. No distress.  HENT:  Head: Normocephalic and atraumatic.  Moderate facial swelling to right upper jaw near right maxillary area, mild tenderness to palpation, no significant erythema overlying, poor dentition to the second to last molar on right upper jaw, surrounding gingival tenderness.  Posterior oropharynx clear without erythema  Eyes: Conjunctivae are normal.   Neck: Neck supple.  Cardiovascular: Normal rate and regular rhythm.  No murmur heard. Pulmonary/Chest: Effort normal. No respiratory distress.  Breathing comfortably at rest  Abdominal: Soft. There is no tenderness.  Musculoskeletal: She exhibits no edema.  Neurological: She is alert.  Skin: Skin is warm and dry.  Psychiatric: She has a normal mood and affect.  Nursing note and vitals reviewed.    UC Treatments / Results  Labs (all labs ordered are listed, but only abnormal results are displayed) Labs Reviewed - No data to display  EKG None Radiology No results found.  Procedures Procedures (including critical care time)  Medications Ordered in UC Medications - No data to display   Initial Impression / Assessment and Plan / UC Course  I have reviewed the triage vital signs and the nursing notes.  Pertinent labs & imaging results that were available during my care of the patient were reviewed by me and considered in my medical decision making (see chart for details).     Patient likely with dental abscess, will begin on Augmentin, continue Tylenol and ibuprofen for mild to moderate pain, hydrocodone for severe pain or at nighttime.  Discussed sedation regarding hydrocodone, do not drive after use.  Advised to return or go to emergency room or follow-up with dentistry if swelling continuing to worsen despite antibiotic use. Discussed strict return precautions. Patient verbalized understanding and is agreeable with plan.   Final Clinical Impressions(s) / UC Diagnoses   Final diagnoses:  Dental abscess    ED Discharge Orders        Ordered    amoxicillin-clavulanate (AUGMENTIN) 875-125 MG tablet  Every 12 hours     10/13/17 1339    HYDROcodone-acetaminophen (NORCO/VICODIN) 5-325 MG tablet  Every 6 hours PRN     10/13/17 1339       Controlled Substance Prescriptions [place] Controlled Substance Registry consulted? Not Applicable   Janith Lima, [place] 10/13/17  1359

## 2017-10-13 NOTE — ED Triage Notes (Signed)
C/o right side face swelling "have a cavity there" onset 2 days

## 2017-10-13 NOTE — Discharge Instructions (Signed)
Please use dental resource to contact offices to seek permenant treatment/relief.   Today we have given you an antibiotic- Augmentin. This should help with pain as any infection is cleared.   For pain please take 600mg -800mg  of Ibuprofen every 8 hours, take with 1000 mg of Tylenol Extra strength every 8 hours. These are safe to take together. Please take with food.   I have also provided 3 days worth of stronger pain medication. This should only be used for severe pain. Do not drive or operate machinery while taking this medication.   Please return if you start to experience significant swelling of your face, experiencing fever.

## 2018-11-02 ENCOUNTER — Telehealth: Payer: Self-pay | Admitting: Physician Assistant

## 2018-11-02 DIAGNOSIS — R109 Unspecified abdominal pain: Secondary | ICD-10-CM

## 2018-11-02 NOTE — Progress Notes (Signed)
Based on what you shared with me, I feel your condition warrants further evaluation and I recommend that you be seen for a face to face office visit.     NOTE: If you entered your credit card information for this eVisit, you will not be charged. You may see a "hold" on your card for the $35 but that hold will drop off and you will not have a charge processed.  If you are having a true medical emergency please call 911.  If you need an urgent face to face visit, [facility] has four urgent care centers for your convenience.    PLEASE NOTE: THE [facility] LOCATIONS AND URGENT CARE CLINICS DO NOT HAVE THE TESTING FOR CORONAVIRUS COVID19 AVAILABLE.  IF YOU FEEL YOU NEED THIS TEST YOU MUST GO TO A TRIAGE LOCATION AT [place] ?  [email or web address] to reserve your spot online an avoid wait times  [facility] [address] [place], [place] [address] Modified hours of operation: Monday-Friday, 12 PM to 6 PM  Saturday & Sunday 10 AM to 4 PM *Across the street from [place] (New Address!) [address] [place], [place] [address] *Just off [address], across the road from [place] hours of operation: Monday-Friday, 12 PM to 6 PM  Closed Saturday & Sunday  [facility]'s modified hours of operation will be in effect from May 1 until May 31   The following sites will take your insurance:  [facility] [place] a Provider at this Location  [place], [place] [address] 10 am to 8 pm Monday-Friday 12 pm to 8 pm [facility] [place] a Provider at this Location  [place], [place], [place] [address] 8 am to 8 pm Monday-Friday 9 am to 6 pm Saturday 11 am to 6 pm Sunday   [facility]  at [facility]  [phone] Get Driving Directions  [address] [place], [place] [address] 8 am to 8 pm Monday-Friday 8 am to 4 pm Saturday-Sunday   Your e-visit answers were reviewed by a board certified advanced clinical practitioner to complete your personal care plan.  Thank you for using e-Visits.  ===View-only below this line===   ----- Message -----    From: Susa Raring    Sent: 11/02/2018  2:58 PM EDT      To: E-Visit Mailing List Subject: E-Visit Submission: Back Pain  E-Visit Submission: Back Pain --------------------------------  Question: Where are you having pain Answer:   Buttocks  Question: Does the pain extend into your legs? Answer:   Yes, into my left leg  Question: Are you having any numbness or weakness of the legs? Answer:   Yes  Question: Does this pain radiate to the abdomen or groin? Answer:   Yes  Question: How bad is the pain? Answer:   The pain is moderate  Question: Did you have an injury that caused the pain? Answer:   No, I cannot remember an injury  Question: How long has the pain been present? Answer:   More than 1 week but less then 4 weeks  Question: Have you had back pain in the past? Answer:   Yes, I have infrequently had pain similar to this before  Question: Do you have a history of fever associated with  your back pain? Answer:   No  Question: Please list any medications you have previously taken for back pain. Answer:   Only ibuprofen or aleve sometimes  Question: Do you have a fever? Answer:   Yes, I have a low fever (less than 101 degrees)  Question: Do you have any of the following? Answer:   Areas that are numb or have a strange sensation            Weakness  Question: What makes the pain worse? Answer:   Bending over            Strenuous activity  Question: What makes the pain better Answer:   None of the above  Question: Do you get relief with certain positions (even if only partial  relief)? Answer:   Yes  Question: Have you ever been diagnosed with cancer? Answer:   No  Question: Have you ever been diagnosed with arthritis? Answer:   No  Question: Have you ever been diagnosed with osteoporosis or any other bone weakness? Answer:   No  Question: Have you ever had surgery on your back or spine? Answer:   No  Question: Do you have a history of Intravenous Drug Use? Answer:   No  Question: What is your usual health status? Answer:   I am active and can move normally  Question: Are you pregnant? Answer:   I am confident that I am not pregnant  Question: Are you breastfeeding? Answer:   No  Question: Please list your medication allergies that you may have ? (If 'none' , please list as 'none') Answer:   None  Question: Please list any additional comments  Answer:   My pain radiates but it is uaully the left side sometimes lower back but most primarily the left buttock and to the point that I can not stand up without shooting pains. I'm not sure how to describe it really. It goes from my buttock to my leg.

## 2020-07-26 ENCOUNTER — Encounter: Payer: Self-pay | Admitting: *Deleted

## 2020-08-04 ENCOUNTER — Encounter: Payer: Self-pay | Admitting: Student

## 2020-08-18 ENCOUNTER — Encounter: Payer: Self-pay | Admitting: Student

## 2023-07-30 ENCOUNTER — Encounter: Payer: Medicaid Other | Admitting: Obstetrics and Gynecology

## 2023-08-23 ENCOUNTER — Other Ambulatory Visit: Payer: Self-pay

## 2023-08-23 ENCOUNTER — Encounter: Payer: Self-pay | Admitting: Obstetrics and Gynecology

## 2023-08-23 ENCOUNTER — Ambulatory Visit: Payer: Medicaid Other | Admitting: Obstetrics and Gynecology

## 2023-08-23 VITALS — BP 107/75 | HR 107 | Wt 206.0 lb

## 2023-08-23 DIAGNOSIS — Z30432 Encounter for removal of intrauterine contraceptive device: Secondary | ICD-10-CM | POA: Diagnosis not present

## 2023-08-23 DIAGNOSIS — Z1331 Encounter for screening for depression: Secondary | ICD-10-CM | POA: Diagnosis not present

## 2023-08-23 NOTE — Progress Notes (Signed)
 NEW GYNECOLOGY PATIENT Patient name: Nancy Horton MRN [id number]  Date of birth: February 18, 1991 Chief Complaint:   Gynecologic Exam     History:  Nancy Horton is a 33 y.o. G1P1001 being seen today for IUD removal. IUD placed in 2013 and since had 2 prior attempts at removal. Does not want another form of contraception at this time. Notes there was use of vacuum aspirator for second attempt. US done about 1.5 years ago that confirmed IUD in place.     Gynecologic History No LMP recorded. (Menstrual status: IUD). Contraception: IUD Last Pap: 07/2023 at [facility] Last Mammogram: n/a Last Colonoscopy: n/a  Obstetric History OB History  Gravida Para Term Preterm AB Living  1 1 1  0 0 1  SAB IAB Ectopic Multiple Live Births  0 0 0 0 1    # Outcome Date GA Lbr Len/2nd Weight Sex Type Anes PTL Lv  1 Term 01/12/12 [redacted]w[redacted]d 05:11 / 00:47 7 lb 14.1 oz (3.575 kg) F Vag-Spont EPI  LIV    Past Medical History:  Diagnosis Date   Depression    Ear problems    Late prenatal care    No pertinent past medical history    Normal pregnancy 01/11/2012   Shortness of breath     Past Surgical History:  Procedure Laterality Date   MYRINGOTOMY      Current Outpatient Medications on File Prior to Visit  Medication Sig Dispense Refill   levonorgestrel (MIRENA) 20 MCG/24HR IUD 1 each by Intrauterine route once.     No current facility-administered medications on file prior to visit.    No Known Allergies  Social History:  reports that she has never smoked. She has never used smokeless tobacco. She reports that she does not drink alcohol and does not use drugs.  Family History  Problem Relation Age of Onset   Diabetes Maternal Grandfather    Heart attack Father     The following portions of the patient's history were reviewed and updated as appropriate: allergies, current medications, past family history, past medical history, past social history, past surgical history and problem  list.  Review of Systems Pertinent items noted in HPI and remainder of comprehensive ROS otherwise negative.  Physical Exam:  BP 107/75   Pulse (!) 107   Wt 206 lb (93.4 kg)  Physical Exam Vitals and nursing note reviewed. Exam conducted with a chaperone present.  Constitutional:      Appearance: Normal appearance.  Pulmonary:     Effort: Pulmonary effort is normal.  Abdominal:     Palpations: Abdomen is soft.  Genitourinary:    General: Normal vulva.     Exam position: Lithotomy position.     Vagina: Normal.     Cervix: Normal.     Comments: IUD strings not visualized IUD note don bedside US Neurological:     Mental Status: She is alert.    IUD Removal  Patient identified, informed consent performed, consent signed.  Patient was in the dorsal lithotomy position, normal external genitalia was noted.  A speculum was placed in the patient's vagina, normal discharge was noted, no lesions. The cervix was visualized, no lesions, no abnormal discharge.  The strings of the IUD were not visualized, so Kelly forceps were introduced into the endometrial cavity and the IUD was grasped and removed in its entirety. Patient tolerated the procedure well.       Assessment and Plan:   1. Encounter for IUD removal (Primary) Now s/p  uncomplicated IUD removal. Not interested in other form of contraception at this time.   2. Positive screening for depression on 9-item Patient Health Questionnaire (PHQ-9) Accepted referral to [facility] - Ambulatory referral to Integrated Behavioral Health    Routine preventative health maintenance measures emphasized. Please refer to After Visit Summary for other counseling recommendations.   Follow-up: No follow-ups on file.      Lorriane Shire, MD Obstetrician & Gynecologist, Faculty Practice Minimally Invasive Gynecologic Surgery Center for [employer], [facility]

## 2023-09-10 NOTE — BH Specialist Note (Unsigned)
 Integrated Behavioral Health via Telemedicine Visit  09/18/2023 GIANI WINTHER [id number]  Number of Integrated Behavioral Health Clinician visits: 1- Initial Visit  Session Start time: 0945   Session End time: 1029  Total time in minutes: 44   Referring Provider: Lorriane Shire, MD Patient/Family location: Home  [facility] Provider location: Center for [facility] at [facility]  All persons participating in visit: Patient Moselle Rister and [facility] Lashaundra Lehrmann   Types of Service: Individual psychotherapy and Video visit  I connected with [name] and/or Eula Fried Purdum's  n/a  via  Telephone or Video Enabled Telemedicine Application  (Video is Caregility application) and verified that I am speaking with the correct person using two identifiers. Discussed confidentiality: Yes   I discussed the limitations of telemedicine and the availability of in person appointments.  Discussed there is a possibility of technology failure and discussed alternative modes of communication if that failure occurs.  I discussed that engaging in this telemedicine visit, they consent to the provision of behavioral healthcare and the services will be billed under their insurance.  Patient and/or legal guardian expressed understanding and consented to Telemedicine visit: Yes   Presenting Concerns: Patient and/or family reports the following symptoms/concerns: Anxiety with panic attacks, crying spells at work, depression, poor sleep quality, excessive worry, appetite changes; attributes to history of untreated anxiety/depression and recent life changes (eviction, staying temporarily with others, moving into new place in one week, change in birth control with first regular period since daughter was born 11 years ago). Pt experiences passive SI with no intent and no plan when she has panic attacks only;  Pt copes with panic by deep breathing; open to implementing  additional self-coping strategy and medication.  Duration of problem: Ongoing since childhood; Severity of problem: severe  Patient and/or Family's Strengths/Protective Factors: {CHL AMB BH PROTECTIVE [phone]}  Goals Addressed: Patient will:  Reduce symptoms of: {IBH Symptoms:21014056}   Increase knowledge and/or ability of: {IBH Patient Tools:21014057}   Demonstrate ability to: {IBH Goals:21014053}  Progress towards Goals: {CHL AMB BH PROGRESS TOWARDS GOALS:[phone]}  Interventions: Interventions utilized:  {IBH Interventions:21014054} Standardized Assessments completed: {IBH Screening Tools:21014051}  Patient and/or Family Response: Patient agrees with treatment plan.   Assessment: Patient currently experiencing ***.   Patient may benefit from psychoeducation and brief therapeutic interventions regarding coping with symptoms of anxiety, depression .  Plan: Follow up with behavioral health clinician on : Two weeks Behavioral recommendations:  -Begin Worry Time strategy, as discussed. Start by setting up start and end time reminders on phone today; continue daily for *** weeks. -Consider daily self-kindness *** Referral(s): Integrated [employer] (In Clinic)  I discussed the assessment and treatment plan with the patient and/or parent/guardian. They were provided an opportunity to ask questions and all were answered. They agreed with the plan and demonstrated an understanding of the instructions.   They were advised to call back or seek an in-person evaluation if the symptoms worsen or if the condition fails to improve as anticipated.  Rae Lips, LCSW     08/23/2023    4:39 PM  Depression screen PHQ 2/9  Decreased Interest 1  Down, Depressed, Hopeless 2  PHQ - 2 Score 3  Altered sleeping 3  Tired, decreased energy 2  Change in appetite 1  Feeling bad or failure about yourself  2  Trouble concentrating 2  Moving slowly or  fidgety/restless 1  Suicidal thoughts 1  PHQ-9 Score 15  08/23/2023    4:39 PM  GAD 7 : Generalized Anxiety Score  Nervous, Anxious, on Edge 2  Control/stop worrying 2  Worry too much - different things 2  Trouble relaxing 2  Restless 2  Easily annoyed or irritable 2  Afraid - awful might happen 2  Total GAD 7 Score 14

## 2023-09-18 ENCOUNTER — Ambulatory Visit: Admitting: Clinical

## 2023-09-18 DIAGNOSIS — F410 Panic disorder [episodic paroxysmal anxiety] without agoraphobia: Secondary | ICD-10-CM | POA: Diagnosis not present

## 2023-09-18 DIAGNOSIS — F411 Generalized anxiety disorder: Secondary | ICD-10-CM

## 2023-09-18 DIAGNOSIS — Z658 Other specified problems related to psychosocial circumstances: Secondary | ICD-10-CM

## 2023-09-18 NOTE — Patient Instructions (Signed)

## 2023-09-20 NOTE — BH Specialist Note (Unsigned)
 Integrated Behavioral Health via Telemedicine Visit  10/05/2023 Nancy Horton [id number]  Number of Integrated Behavioral Health Clinician visits: 2- Second Visit  Session Start time: 1317   Session End time: 1345  Total time in minutes: 28   Referring Provider: Lorriane Shire, [facility] Patient/Family location: Work/[place] [facility] Provider location: Center for [employer] at [facility]  All persons participating in visit: Patient Nancy Horton and [name]   Types of Service: Individual psychotherapy and Video visit  I connected with [name] and/or [name]'s  n/a  via  Telephone or Video Enabled Telemedicine Application  (Video is Caregility application) and verified that I am speaking with the correct person using two identifiers. Discussed confidentiality: Yes   I discussed the limitations of telemedicine and the availability of in person appointments.  Discussed there is a possibility of technology failure and discussed alternative modes of communication if that failure occurs.  I discussed that engaging in this telemedicine visit, they consent to the provision of behavioral healthcare and the services will be billed under their insurance.  Patient and/or legal guardian expressed understanding and consented to Telemedicine visit: Yes   Presenting Concerns: Patient and/or family reports the following symptoms/concerns: Worry about unable to remember many years of her childhood, poor relationship with mother and sister, continued anxiety with panic, life stress and depression; pt is using self-coping strategies and support from boyfriend as needed; open to implementing additional strategy today.  Duration of problem: Ongoing since childhood; Severity of problem: severe  Patient and/or Family's Strengths/Protective Factors: Social connections, Concrete supports in place (healthy food, safe environments, etc.), and Sense of  purpose  Goals Addressed: Patient will:  Reduce symptoms of: anxiety and depression   Increase knowledge and/or ability of: self-management skills   Demonstrate ability to: Increase healthy adjustment to current life circumstances and Increase motivation to adhere to plan of care  Progress towards Goals: Ongoing  Interventions: Interventions utilized:  Mindfulness or Relaxation Training and Supportive Reflection Standardized Assessments completed: Not Needed  Patient and/or Family Response: Patient agrees with treatment plan.   Assessment: Patient currently experiencing Generalized anxiety disorder with panic; Psychosocial stress.   Patient may benefit from continued therapeutic intervention  .  Plan: Follow up with behavioral health clinician on : Three weeks Behavioral recommendations:  -Continue daily self-coping strategies as needed (deep breathing, worry time, self-kindness) -Begin CALM relaxation breathing exercise twice daily (morning; at bedtime with sleep sounds); as needed throughout the day.  Referral(s): Integrated [employer] (In Clinic)  I discussed the assessment and treatment plan with the patient and/or parent/guardian. They were provided an opportunity to ask questions and all were answered. They agreed with the plan and demonstrated an understanding of the instructions.   They were advised to call back or seek an in-person evaluation if the symptoms worsen or if the condition fails to improve as anticipated.  Rae Lips, LCSW     08/23/2023    4:39 PM  Depression screen PHQ 2/9  Decreased Interest 1  Down, Depressed, Hopeless 2  PHQ - 2 Score 3  Altered sleeping 3  Tired, decreased energy 2  Change in appetite 1  Feeling bad or failure about yourself  2  Trouble concentrating 2  Moving slowly or fidgety/restless 1  Suicidal thoughts 1  PHQ-9 Score 15      08/23/2023    4:39 PM  GAD 7 : Generalized Anxiety Score  Nervous,  Anxious, on Edge  2  Control/stop worrying 2  Worry too much - different things 2  Trouble relaxing 2  Restless 2  Easily annoyed or irritable 2  Afraid - awful might happen 2  Total GAD 7 Score 14

## 2023-10-04 ENCOUNTER — Ambulatory Visit: Admitting: Clinical

## 2023-10-04 DIAGNOSIS — F410 Panic disorder [episodic paroxysmal anxiety] without agoraphobia: Secondary | ICD-10-CM | POA: Diagnosis not present

## 2023-10-04 DIAGNOSIS — Z658 Other specified problems related to psychosocial circumstances: Secondary | ICD-10-CM

## 2023-10-04 DIAGNOSIS — F411 Generalized anxiety disorder: Secondary | ICD-10-CM

## 2023-10-05 NOTE — Patient Instructions (Signed)
 [facility] for [facility] at [facility] [address] [place], [place] [address] [phone] (main office) [phone] (Deleon Passe's office)  /Emotional Wellbeing Apps and Websites Here are a few free apps meant to help you to help yourself.  To find, try searching on the internet to see if the app is offered on Apple/Android devices. If your first choice doesn't come up on your device, the good news is that there are many choices! Play around with different apps to see which ones are helpful to you.    Calm This is an app meant to help increase calm feelings. Includes info, strategies, and tools for tracking your feelings.      Calm Harm  This app is meant to help with self-harm. Provides many 5-minute or 15-min coping strategies for doing instead of hurting yourself.       Healthy Minds Health Minds is a problem-solving tool to help deal with emotions and cope with stress you encounter wherever you are.      MindShift This app can help people cope with anxiety. Rather than trying to avoid anxiety, you can make an important shift and face it.      MY3  MY3 features a support system, safety plan and resources with the goal of offering a tool to use in a time of need.       My Life My Voice  This mood journal offers a simple solution for tracking your thoughts, feelings and moods. Animated emoticons can help identify your mood.       Relax Melodies Designed to help with sleep, on this app you can mix sounds and meditations for relaxation.      Smiling Mind Smiling Mind is meditation made easy: it's a simple tool that helps put a smile on your mind.        Stop, Breathe & Think  A friendly, simple guide for people through meditations for mindfulness and compassion.  Stop, Breathe and Think Kids Enter your current feelings and choose a "mission" to help you cope. Offers videos for certain moods instead of just sound recordings.       Team  Orange The goal of this tool is to help teens change how they think, act, and react. This app helps you focus on your own good feelings and experiences.      The [employer] Box The [employer] Box (VHB) contains simple tools to help patients with coping, relaxation, distraction, and positive thinking.

## 2023-10-26 NOTE — BH Specialist Note (Unsigned)
 Pt did not arrive to video visit and did not answer the phone; Left HIPPA-compliant message to call back Carolyn Cisco from [employer] for [employer] at [facility] at  [phone] [facility] office).  ?; left MyChart message for patient.  ? ?

## 2023-10-31 ENCOUNTER — Ambulatory Visit: Admitting: Clinical

## 2023-10-31 DIAGNOSIS — Z91199 Patient's noncompliance with other medical treatment and regimen due to unspecified reason: Secondary | ICD-10-CM

## 2024-04-28 ENCOUNTER — Ambulatory Visit (HOSPITAL_COMMUNITY)
Admission: EM | Admit: 2024-04-28 | Discharge: 2024-04-28 | Disposition: A | Discharge status: Home / Self Care | Attending: Internal Medicine | Admitting: Internal Medicine

## 2024-04-28 ENCOUNTER — Encounter (HOSPITAL_COMMUNITY): Payer: Self-pay | Admitting: *Deleted

## 2024-04-28 DIAGNOSIS — S161XXA Strain of muscle, fascia and tendon at neck level, initial encounter: Secondary | ICD-10-CM

## 2024-04-28 MED ORDER — BACLOFEN 10 MG PO TABS: 10.0000 mg | ORAL_TABLET | Freq: Three times a day (TID) | ORAL | 0 refills | Status: AC
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
